# Patient Record
Sex: Female | Born: 1960 | Race: White | Hispanic: No | Marital: Married | Smoking: Never smoker
Health system: Southern US, Community
[De-identification: ages and names within clinical notes are randomized; demographics above are authoritative.]

## PROBLEM LIST (undated history)

## (undated) DIAGNOSIS — M79604 Pain in right leg: Secondary | ICD-10-CM

## (undated) DIAGNOSIS — D649 Anemia, unspecified: Secondary | ICD-10-CM

## (undated) DIAGNOSIS — M79605 Pain in left leg: Secondary | ICD-10-CM

## (undated) DIAGNOSIS — T7840XA Allergy, unspecified, initial encounter: Secondary | ICD-10-CM

## (undated) DIAGNOSIS — F419 Anxiety disorder, unspecified: Secondary | ICD-10-CM

## (undated) DIAGNOSIS — Z8489 Family history of other specified conditions: Secondary | ICD-10-CM

## (undated) HISTORY — DX: Anemia, unspecified: D64.9

## (undated) HISTORY — DX: Allergy, unspecified, initial encounter: T78.40XA

## (undated) HISTORY — PX: CATARACT EXTRACTION: SUR2

## (undated) HISTORY — DX: Pain in left leg: M79.605

## (undated) HISTORY — DX: Pain in right leg: M79.604

## (undated) HISTORY — DX: Anxiety disorder, unspecified: F41.9

---

## 2000-12-07 ENCOUNTER — Other Ambulatory Visit: Admission: RE | Admit: 2000-12-07 | Discharge: 2000-12-07 | Payer: Self-pay | Admitting: Obstetrics & Gynecology

## 2002-07-19 HISTORY — PX: LESION EXCISION: SHX5167

## 2002-08-14 ENCOUNTER — Ambulatory Visit (HOSPITAL_BASED_OUTPATIENT_CLINIC_OR_DEPARTMENT_OTHER): Admission: RE | Admit: 2002-08-14 | Discharge: 2002-08-14 | Payer: Self-pay | Admitting: Plastic Surgery

## 2003-05-09 ENCOUNTER — Encounter: Admission: RE | Admit: 2003-05-09 | Discharge: 2003-08-07 | Payer: Self-pay

## 2003-06-18 ENCOUNTER — Other Ambulatory Visit: Admission: RE | Admit: 2003-06-18 | Discharge: 2003-06-18 | Payer: Self-pay | Admitting: Obstetrics & Gynecology

## 2003-08-08 ENCOUNTER — Encounter: Admission: RE | Admit: 2003-08-08 | Discharge: 2003-11-06 | Payer: Self-pay

## 2004-12-06 ENCOUNTER — Emergency Department (HOSPITAL_COMMUNITY): Admission: EM | Admit: 2004-12-06 | Discharge: 2004-12-06 | Payer: Self-pay | Admitting: Emergency Medicine

## 2007-02-21 ENCOUNTER — Encounter: Admission: RE | Admit: 2007-02-21 | Discharge: 2007-05-22 | Payer: Self-pay | Admitting: Internal Medicine

## 2011-04-29 ENCOUNTER — Encounter: Payer: Self-pay | Admitting: Vascular Surgery

## 2011-05-06 ENCOUNTER — Encounter: Payer: Self-pay | Admitting: Vascular Surgery

## 2011-05-08 ENCOUNTER — Encounter: Payer: Self-pay | Admitting: Vascular Surgery

## 2011-05-20 ENCOUNTER — Ambulatory Visit (INDEPENDENT_AMBULATORY_CARE_PROVIDER_SITE_OTHER): Payer: BC Managed Care – PPO | Admitting: Vascular Surgery

## 2011-05-20 ENCOUNTER — Encounter: Payer: Self-pay | Admitting: Vascular Surgery

## 2011-05-20 ENCOUNTER — Encounter (INDEPENDENT_AMBULATORY_CARE_PROVIDER_SITE_OTHER): Payer: BC Managed Care – PPO

## 2011-05-20 VITALS — BP 144/79 | HR 82 | Resp 16 | Ht 63.0 in | Wt 170.0 lb

## 2011-05-20 DIAGNOSIS — I781 Nevus, non-neoplastic: Secondary | ICD-10-CM

## 2011-05-20 DIAGNOSIS — M79609 Pain in unspecified limb: Secondary | ICD-10-CM

## 2011-05-20 DIAGNOSIS — I83893 Varicose veins of bilateral lower extremities with other complications: Secondary | ICD-10-CM

## 2011-05-20 NOTE — Progress Notes (Signed)
VV initial consult. Bilateral leg pain, left>right.  Pain worse at the end of the day and on long car trips.

## 2011-05-21 NOTE — Consult Note (Signed)
NEW PATIENT CONSULTATION  Kara Carey, BIRKELAND DOB:  [birth year]                                       05/20/2011 [id number]#:[id number]  Patient presents today for evaluation of bilateral lower extremity venous pathology.  She reports a long history of difficulty with swelling and discomfort in her lower extremities bilaterally.  She reports this is more prominent at the end of a long day of standing or on car trips.  She does not have any history of DVT or bleeding.  She does have a history of significant maternal varicose veins requiring treatment.  PAST MEDICAL HISTORY:  Negative for diabetes, hypertension or other major medical difficulties.  She has had removal of prior dermatofibromas.  FAMILY HISTORY:  Positive for varicose veins and arrhythmia in her mother.  No premature atherosclerotic disease in her father.  Elevated cholesterol in a sister and brother.  SOCIAL HISTORY:  She is married.  She has no children.  She works as an Programmer, multimedia.  She does not smoke or drink alcohol.  REVIEW OF SYSTEMS:  No weight loss or gain.  She weighs 170 pounds.  She is 5 feet 3 inches tall.  Review of systems otherwise totally normal.  PHYSICAL EXAMINATION:  A well-developed and well-nourished white female appearing stated age in no acute distress.  Blood pressures is 144/79, pulse 82, respirations 16.  HEENT:  Normal.  Her musculoskeletal shows no major deformity or cyanosis.  Neurologic:  No focal weakness or paresthesias.  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  She underwent duplex evaluation, and this shows no evidence of deep venous reflux and no evidence of significant reflux in her saphenous veins bilaterally.  I discussed the significance of this with patient. I do not see any evidence of venous pathology to explain her leg aching. She has had sclerotherapy treatments in the past with varying  degrees of success and wishes to consider sclerotherapy for cosmetic improvement of these.  I discussed the option of this for the telangiectasia and also for the underlying reticular veins.  She understands the procedure and expected expense of this and wishes to discuss this further.  She was given contact information for Clementeen Hoof, RN, to schedule sclerotherapy at her convenience.    Larina Earthly, M.D. Electronically Signed  TFE/MEDQ  D:  05/20/2011  T:  05/21/2011  Job:  [id number]  cc:   Bufford Buttner, MD

## 2011-05-26 ENCOUNTER — Encounter: Payer: Self-pay | Admitting: *Deleted

## 2011-05-28 ENCOUNTER — Ambulatory Visit (INDEPENDENT_AMBULATORY_CARE_PROVIDER_SITE_OTHER): Payer: BC Managed Care – PPO | Admitting: *Deleted

## 2011-05-28 DIAGNOSIS — I83893 Varicose veins of bilateral lower extremities with other complications: Secondary | ICD-10-CM

## 2011-05-28 NOTE — Progress Notes (Signed)
Pt. Here for her first sclerotherapy tx. Spiders and retics. Scattered over both legs but mostly on backs of knee calf area. Will follow prn.

## 2011-06-08 NOTE — Procedures (Unsigned)
LOWER EXTREMITY VENOUS REFLUX EXAM  INDICATION:  Painful varicose veins.  EXAM:  Using color-flow imaging and pulse Doppler spectral analysis, the bilateral common femoral, superficial femoral, popliteal, posterior tibial, greater and lesser saphenous veins are evaluated.  There is no evidence suggesting deep venous insufficiency in the bilateral lower extremity.  The bilateral saphenofemoral junctions are competent. The bilateral GSV's are competent.  The bilateral proximal short saphenous veins demonstrate competency.  GSV Diameter (used if found to be incompetent only)                                           Right    Left Proximal Greater Saphenous Vein           cm       cm Proximal-to-mid-thigh                     cm       cm Mid thigh                                 cm       cm Mid-distal thigh                          cm       cm Distal thigh                              cm       cm Knee                                      cm       cm  IMPRESSION: 1. Bilateral greater saphenous veins are competent. 2. The bilateral greater saphenous veins are not tortuous. 3. The deep venous system is competent. 4. The bilateral small saphenous veins are competent.  ___________________________________________ Larina Earthly, M.D.  LT/MEDQ  D:  05/20/2011  T:  05/20/2011  Job:  [id number]

## 2011-06-09 ENCOUNTER — Telehealth: Payer: Self-pay | Admitting: *Deleted

## 2011-06-09 NOTE — Telephone Encounter (Signed)
Pt had sclero tx 05/28/11. Calling today c/o "charlie horse" in calf. Area is 3 inches away from injection site. Feels like muscle soreness. Came on three days ago. Area slightly swollen. No apparent bruising, discoloration, hardness. Denies injury. Suggested Ibuprofen, ice or heat; and asked her to keep me posted. No ankle or foot swelling. Pain is not severe, just bothersome. Pt. Appeared to be reassured.

## 2014-05-13 ENCOUNTER — Ambulatory Visit (INDEPENDENT_AMBULATORY_CARE_PROVIDER_SITE_OTHER): Payer: BC Managed Care – PPO | Admitting: Family Medicine

## 2014-05-13 VITALS — BP 115/75 | HR 56 | Temp 97.7°F | Resp 16 | Ht 64.0 in | Wt 163.0 lb

## 2014-05-13 DIAGNOSIS — L659 Nonscarring hair loss, unspecified: Secondary | ICD-10-CM

## 2014-05-13 DIAGNOSIS — Z0000 Encounter for general adult medical examination without abnormal findings: Secondary | ICD-10-CM

## 2014-05-13 LAB — COMPREHENSIVE METABOLIC PANEL
ALT: 15 U/L (ref 0–35)
AST: 17 U/L (ref 0–37)
Albumin: 4.4 g/dL (ref 3.5–5.2)
Alkaline Phosphatase: 78 U/L (ref 39–117)
BUN: 13 mg/dL (ref 6–23)
CO2: 29 mEq/L (ref 19–32)
Calcium: 9.8 mg/dL (ref 8.4–10.5)
Chloride: 102 mEq/L (ref 96–112)
Creat: 0.61 mg/dL (ref 0.50–1.10)
Glucose, Bld: 99 mg/dL (ref 70–99)
Potassium: 4.9 mEq/L (ref 3.5–5.3)
Sodium: 141 mEq/L (ref 135–145)
Total Bilirubin: 0.7 mg/dL (ref 0.2–1.2)
Total Protein: 7.5 g/dL (ref 6.0–8.3)

## 2014-05-13 LAB — POCT CBC
Granulocyte percent: 64.1 %G (ref 37–80)
HCT, POC: 41.2 % (ref 37.7–47.9)
Hemoglobin: 13.4 g/dL (ref 12.2–16.2)
Lymph, poc: 1.5 (ref 0.6–3.4)
MCH, POC: 29.3 pg (ref 27–31.2)
MCHC: 32.4 g/dL (ref 31.8–35.4)
MCV: 90.5 fL (ref 80–97)
MID (cbc): 0.3 (ref 0–0.9)
MPV: 7.1 fL (ref 0–99.8)
POC Granulocyte: 3.1 (ref 2–6.9)
POC LYMPH PERCENT: 30.4 %L (ref 10–50)
POC MID %: 5.5 %M (ref 0–12)
Platelet Count, POC: 323 10*3/uL (ref 142–424)
RBC: 4.56 M/uL (ref 4.04–5.48)
RDW, POC: 13.3 %
WBC: 4.9 10*3/uL (ref 4.6–10.2)

## 2014-05-13 LAB — LIPID PANEL
Cholesterol: 201 mg/dL — ABNORMAL HIGH (ref 0–200)
HDL: 46 mg/dL (ref >39)
LDL Cholesterol: 137 mg/dL — ABNORMAL HIGH (ref 0–99)
Total CHOL/HDL Ratio: 4.4 Ratio
Triglycerides: 91 mg/dL (ref <150)
VLDL: 18 mg/dL (ref 0–40)

## 2014-05-13 LAB — TSH: TSH: 1.404 u[IU]/mL (ref 0.350–4.500)

## 2014-05-13 NOTE — Patient Instructions (Signed)
Patient ID: Kara Carey MRN: [id number], DOB: [birth year], 53 y.o. Date of Encounter: 05/13/2014, 9:57 AM  Primary Physician: No PCP Per Patient  Chief Complaint: Physical (CPE)  HPI: 53 y.o. y/o female with history of noted below here for CPE.  Doing well. No issues/complaints.  LMP: more than 2 years Pap: within last 3 years MMG:  May of this year Last Td: 4 years ago Review of Systems: Consitutional: No fever, chills, fatigue, night sweats, lymphadenopathy, or weight changes. Eyes: No visual changes, eye redness, or discharge. ENT/Mouth: Ears: No otalgia, tinnitus, hearing loss, discharge. Nose: No congestion, rhinorrhea, sinus pain, or epistaxis. Throat: No sore throat, post nasal drip, or teeth pain. Cardiovascular: No CP, palpitations, diaphoresis, DOE, edema, orthopnea, PND. Respiratory: No cough, hemoptysis, SOB, or wheezing. Gastrointestinal: No anorexia, dysphagia, reflux, pain, nausea, vomiting, hematemesis, diarrhea, constipation, BRBPR, or melena. Breast: No discharge, pain, swelling, or mass. Genitourinary: No dysuria, frequency, urgency, hematuria, incontinence, nocturia, amenorrhea, vaginal discharge, pruritis, burning, abnormal bleeding, or pain. Musculoskeletal: No decreased ROM, myalgias, stiffness, joint swelling, or weakness. Skin: No rash, erythema, lesion changes, pain, warmth, jaundice, or pruritis.  Some hair loss Neurological: No headache, dizziness, syncope, seizures, tremors, memory loss, coordination problems, or paresthesias. Psychological: No anxiety, depression, hallucinations, SI/HI. Endocrine: No fatigue, polydipsia, polyphagia, polyuria, or known diabetes.  H/O iron deficiency anemia, treated and corrected All other systems were reviewed and are otherwise negative.  Past Medical History  Diagnosis Date  . Asthma   . Anxiety hx of panic attacks  . Bilateral leg pain   . Allergy   . Anemia      Past Surgical History  Procedure  Laterality Date  . Lesion excision  left upper arm october 2003    Home Meds:  Prior to Admission medications   Medication Sig Start Date End Date Taking? Authorizing Provider  aspirin 81 MG tablet Take 81 mg by mouth daily.     Yes Historical Provider, MD    Allergies:  Allergies  Allergen Reactions  . Levofloxacin   . Sulfa Antibiotics     History   Social History  . Marital Status: Married    Spouse Name: N/A    Number of Children: N/A  . Years of Education: N/A   Occupational History  . Not on file.   Social History Main Topics  . Smoking status: Never Smoker   . Smokeless tobacco: Not on file  . Alcohol Use: No  . Drug Use: No  . Sexual Activity: Not on file   Other Topics Concern  . Not on file   Social History Narrative  . No narrative on file    Family History  Problem Relation Age of Onset  . Heart disease Mother   . Hyperlipidemia Sister   . Hyperlipidemia Brother     Physical Exam:  Waist circumference: 34 Blood pressure 115/75, pulse 56, temperature 97.7 F (36.5 C), resp. rate 16, height 5' 4"  (1.626 m), weight 163 lb (73.936 kg), SpO2 98.00%., Body mass index is 27.97 kg/(m^2). Wt Readings from Last 3 Encounters:  05/13/14 163 lb (73.936 kg)  05/20/11 170 lb (77.111 kg)   BP Readings from Last 3 Encounters:  05/13/14 115/75  05/20/11 144/79   General: Well developed, well nourished, in no acute distress. HEENT: Normocephalic, atraumatic. Conjunctiva pink, sclera non-icteric. Pupils 2 mm constricting to 1 mm, round, regular, and equally reactive to light and accomodation. EOMI. Internal auditory canal clear. TMs with good cone of light and without  pathology. Nasal mucosa pink. Nares are without discharge. No sinus tenderness. Oral mucosa pink. Dentition excellent. Pharynx without exudate.     Visual Acuity  Right Eye Distance:   Left Eye Distance:   Bilateral Distance:    Right Eye Near:   Left Eye Near:    Bilateral Near:       Neck: Supple. Trachea midline. No thyromegaly. Full ROM. No lymphadenopathy. Lungs: Clear to auscultation bilaterally without wheezes, rales, or rhonchi. Breathing is of normal effort and unlabored. Cardiovascular: RRR with S1 S2. No murmurs, rubs, or gallops appreciated. Distal pulses 2+ symmetrically. No carotid or abdominal bruits Abdomen: Soft, non-tender, non-distended with normoactive bowel sounds. No hepatosplenomegaly or masses. No rebound/guarding. No CVA tenderness. Without hernias.  Musculoskeletal: Full range of motion and 5/5 strength throughout. Without swelling, atrophy, tenderness, crepitus, or warmth. Extremities without clubbing, cyanosis, or edema. Calves supple. Skin: Warm and moist without erythema, ecchymosis, wounds, or rash. Neuro: A+Ox3. CN II-XII grossly intact. Moves all extremities spontaneously. Full sensation throughout. Normal gait. DTR 2+ throughout upper and lower extremities. Finger to nose intact. Psych:  Responds to questions appropriately with a normal affect.   No results found for this basename: CHOL, HDL, LDLCALC, LDLDIRECT, TRIG, CHOLHDL    Assessment/Plan:  53 y.o. y/o female here for CPE -Annual physical exam - Plan: POCT CBC, Comprehensive metabolic panel, Lipid panel, TSH  Signed, Robyn Haber, MD 05/13/2014 9:57 AM    Colonoscopy A colonoscopy is an exam to look at the entire large intestine (colon). This exam can help find problems such as tumors, polyps, inflammation, and areas of bleeding. The exam takes about 1 hour.  LET [facility] CARE PROVIDER KNOW ABOUT:   Any allergies you have.  All medicines you are taking, including vitamins, herbs, eye drops, creams, and over-the-counter medicines.  Previous problems you or members of your family have had with the use of anesthetics.  Any blood disorders you have.  Previous surgeries you have had.  Medical conditions you have. RISKS AND COMPLICATIONS  Generally, this is a safe  procedure. However, as with any procedure, complications can occur. Possible complications include:  Bleeding.  Tearing or rupture of the colon wall.  Reaction to medicines given during the exam.  Infection (rare). BEFORE THE PROCEDURE   Ask your health care provider about changing or stopping your regular medicines.  You may be prescribed an oral bowel prep. This involves drinking a large amount of medicated liquid, starting the day before your procedure. The liquid will cause you to have multiple loose stools until your stool is almost clear or light green. This cleans out your colon in preparation for the procedure.  Do not eat or drink anything else once you have started the bowel prep, unless your health care provider tells you it is safe to do so.  Arrange for someone to drive you home after the procedure. PROCEDURE   You will be given medicine to help you relax (sedative).  You will lie on your side with your knees bent.  A long, flexible tube with a light and camera on the end (colonoscope) will be inserted through the rectum and into the colon. The camera sends video back to a computer screen as it moves through the colon. The colonoscope also releases carbon dioxide gas to inflate the colon. This helps your health care provider see the area better.  During the exam, your health care provider may take a small tissue sample (biopsy) to be examined under a  microscope if any abnormalities are found.  The exam is finished when the entire colon has been viewed. AFTER THE PROCEDURE   Do not drive for 24 hours after the exam.  You may have a small amount of blood in your stool.  You may pass moderate amounts of gas and have mild abdominal cramping or bloating. This is caused by the gas used to inflate your colon during the exam.  Ask when your test results will be ready and how you will get your results. Make sure you get your test results. Document Released: 10/02/2000  Document Revised: 07/26/2013 Document Reviewed: 06/12/2013 [facility] Patient Information 2015 [place], [place]. This information is not intended to replace advice given to you by your health care provider. Make sure you discuss any questions you have with your health care provider. Health Maintenance Adopting a healthy lifestyle and getting preventive care can go a long way to promote health and wellness. Talk with your health care provider about what schedule of regular examinations is right for you. This is a good chance for you to check in with your provider about disease prevention and staying healthy. In between checkups, there are plenty of things you can do on your own. Experts have done a lot of research about which lifestyle changes and preventive measures are most likely to keep you healthy. Ask your health care provider for more information. WEIGHT AND DIET  Eat a healthy diet  Be sure to include plenty of vegetables, fruits, low-fat dairy products, and lean protein.  Do not eat a lot of foods high in solid fats, added sugars, or salt.  Get regular exercise. This is one of the most important things you can do for your health.  Most adults should exercise for at least 150 minutes each week. The exercise should increase your heart rate and make you sweat (moderate-intensity exercise).  Most adults should also do strengthening exercises at least twice a week. This is in addition to the moderate-intensity exercise.  Maintain a healthy weight  Body mass index (BMI) is a measurement that can be used to identify possible weight problems. It estimates body fat based on height and weight. Your health care provider can help determine your BMI and help you achieve or maintain a healthy weight.  For females 21 years of age and older:   A BMI below 18.5 is considered underweight.  A BMI of 18.5 to 24.9 is normal.  A BMI of 25 to 29.9 is considered overweight.  A BMI of 30 and above is  considered obese.  Watch levels of cholesterol and blood lipids  You should start having your blood tested for lipids and cholesterol at 55 years of age, then have this test every 5 years.  You may need to have your cholesterol levels checked more often if:  Your lipid or cholesterol levels are high.  You are older than 55 years of age.  You are at high risk for heart disease.  CANCER SCREENING   Lung Cancer  Lung cancer screening is recommended for adults 101-6 years old who are at high risk for lung cancer because of a history of smoking.  A yearly low-dose CT scan of the lungs is recommended for people who:  Currently smoke.  Have quit within the past 15 years.  Have at least a 30-pack-year history of smoking. A pack year is smoking an average of one pack of cigarettes a day for 1 year.  Yearly screening should continue until it  has been 15 years since you quit.  Yearly screening should stop if you develop a health problem that would prevent you from having lung cancer treatment.  Breast Cancer  Practice breast self-awareness. This means understanding how your breasts normally appear and feel.  It also means doing regular breast self-exams. Let your health care provider know about any changes, no matter how small.  If you are in your 20s or 30s, you should have a clinical breast exam (CBE) by a health care provider every 1-3 years as part of a regular health exam.  If you are 98 or older, have a CBE every year. Also consider having a breast X-ray (mammogram) every year.  If you have a family history of breast cancer, talk to your health care provider about genetic screening.  If you are at high risk for breast cancer, talk to your health care provider about having an MRI and a mammogram every year.  Breast cancer gene (BRCA) assessment is recommended for women who have family members with BRCA-related cancers. BRCA-related cancers  include:  Breast.  Ovarian.  Tubal.  Peritoneal cancers.  Results of the assessment will determine the need for genetic counseling and BRCA1 and BRCA2 testing. Cervical Cancer Routine pelvic examinations to screen for cervical cancer are no longer recommended for nonpregnant women who are considered low risk for cancer of the pelvic organs (ovaries, uterus, and vagina) and who do not have symptoms. A pelvic examination may be necessary if you have symptoms including those associated with pelvic infections. Ask your health care provider if a screening pelvic exam is right for you.   The Pap test is the screening test for cervical cancer for women who are considered at risk.  If you had a hysterectomy for a problem that was not cancer or a condition that could lead to cancer, then you no longer need Pap tests.  If you are older than 65 years, and you have had normal Pap tests for the past 10 years, you no longer need to have Pap tests.  If you have had past treatment for cervical cancer or a condition that could lead to cancer, you need Pap tests and screening for cancer for at least 20 years after your treatment.  If you no longer get a Pap test, assess your risk factors if they change (such as having a new sexual partner). This can affect whether you should start being screened again.  Some women have medical problems that increase their chance of getting cervical cancer. If this is the case for you, your health care provider may recommend more frequent screening and Pap tests.  The human papillomavirus (HPV) test is another test that may be used for cervical cancer screening. The HPV test looks for the virus that can cause cell changes in the cervix. The cells collected during the Pap test can be tested for HPV.  The HPV test can be used to screen women 77 years of age and older. Getting tested for HPV can extend the interval between normal Pap tests from three to five years.  An HPV  test also should be used to screen women of any age who have unclear Pap test results.  After 55 years of age, women should have HPV testing as often as Pap tests.  Colorectal Cancer  This type of cancer can be detected and often prevented.  Routine colorectal cancer screening usually begins at 55 years of age and continues through 55 years of age.  Your health care provider may recommend screening at an earlier age if you have risk factors for colon cancer.  Your health care provider may also recommend using home test kits to check for hidden blood in the stool.  A small camera at the end of a tube can be used to examine your colon directly (sigmoidoscopy or colonoscopy). This is done to check for the earliest forms of colorectal cancer.  Routine screening usually begins at age 11.  Direct examination of the colon should be repeated every 5-10 years through 55 years of age. However, you may need to be screened more often if early forms of precancerous polyps or small growths are found. Skin Cancer  Check your skin from head to toe regularly.  Tell your health care provider about any new moles or changes in moles, especially if there is a change in a mole's shape or color.  Also tell your health care provider if you have a mole that is larger than the size of a pencil eraser.  Always use sunscreen. Apply sunscreen liberally and repeatedly throughout the day.  Protect yourself by wearing long sleeves, pants, a wide-brimmed hat, and sunglasses whenever you are outside. HEART DISEASE, DIABETES, AND HIGH BLOOD PRESSURE   Have your blood pressure checked at least every 1-2 years. High blood pressure causes heart disease and increases the risk of stroke.  If you are between 28 years and 75 years old, ask your health care provider if you should take aspirin to prevent strokes.  Have regular diabetes screenings. This involves taking a blood sample to check your fasting blood sugar  level.  If you are at a normal weight and have a low risk for diabetes, have this test once every three years after 55 years of age.  If you are overweight and have a high risk for diabetes, consider being tested at a younger age or more often. PREVENTING INFECTION  Hepatitis B  If you have a higher risk for hepatitis B, you should be screened for this virus. You are considered at high risk for hepatitis B if:  You were born in a country where hepatitis B is common. Ask your health care provider which countries are considered high risk.  Your parents were born in a high-risk country, and you have not been immunized against hepatitis B (hepatitis B vaccine).  You have HIV or AIDS.  You use needles to inject street drugs.  You live with someone who has hepatitis B.  You have had sex with someone who has hepatitis B.  You get hemodialysis treatment.  You take certain medicines for conditions, including cancer, organ transplantation, and autoimmune conditions. Hepatitis C  Blood testing is recommended for:  Everyone born from [birth year] through 1967.  Anyone with known risk factors for hepatitis C. Sexually transmitted infections (STIs)  You should be screened for sexually transmitted infections (STIs) including gonorrhea and chlamydia if:  You are sexually active and are younger than 55 years of age.  You are older than 55 years of age and your health care provider tells you that you are at risk for this type of infection.  Your sexual activity has changed since you were last screened and you are at an increased risk for chlamydia or gonorrhea. Ask your health care provider if you are at risk.  If you do not have HIV, but are at risk, it may be recommended that you take a prescription medicine daily to prevent HIV infection. This is called  pre-exposure prophylaxis (PrEP). You are considered at risk if:  You are sexually active and do not regularly use condoms or know the HIV status  of your partner(s).  You take drugs by injection.  You are sexually active with a partner who has HIV. Talk with your health care provider about whether you are at high risk of being infected with HIV. If you choose to begin PrEP, you should first be tested for HIV. You should then be tested every 3 months for as long as you are taking PrEP.  PREGNANCY   If you are premenopausal and you may become pregnant, ask your health care provider about preconception counseling.  If you may become pregnant, take 400 to 800 micrograms (mcg) of folic acid every day.  If you want to prevent pregnancy, talk to your health care provider about birth control (contraception). OSTEOPOROSIS AND MENOPAUSE   Osteoporosis is a disease in which the bones lose minerals and strength with aging. This can result in serious bone fractures. Your risk for osteoporosis can be identified using a bone density scan.  If you are 73 years of age or older, or if you are at risk for osteoporosis and fractures, ask your health care provider if you should be screened.  Ask your health care provider whether you should take a calcium or vitamin D supplement to lower your risk for osteoporosis.  Menopause may have certain physical symptoms and risks.  Hormone replacement therapy may reduce some of these symptoms and risks. Talk to your health care provider about whether hormone replacement therapy is right for you.  HOME CARE INSTRUCTIONS   Schedule regular health, dental, and eye exams.  Stay current with your immunizations.   Do not use any tobacco products including cigarettes, chewing tobacco, or electronic cigarettes.  If you are pregnant, do not drink alcohol.  If you are breastfeeding, limit how much and how often you drink alcohol.  Limit alcohol intake to no more than 1 drink per day for nonpregnant women. One drink equals 12 ounces of beer, 5 ounces of wine, or 1 ounces of hard liquor.  Do not use street  drugs.  Do not share needles.  Ask your health care provider for help if you need support or information about quitting drugs.  Tell your health care provider if you often feel depressed.  Tell your health care provider if you have ever been abused or do not feel safe at home. Document Released: 04/20/2011 Document Revised: 02/19/2014 Document Reviewed: 09/06/2013 [facility] Patient Information 2015 [place], [place]. This information is not intended to replace advice given to you by your health care provider. Make sure you discuss any questions you have with your health care provider.

## 2014-05-13 NOTE — Progress Notes (Signed)
Patient ID: Kara Carey MRN: [id number], DOB: [birth year], 53 y.o. Date of Encounter: 05/13/2014, 9:57 AM  Primary Physician: No PCP Per Patient  Chief Complaint: Physical (CPE)  HPI: 53 y.o. y/o female with history of noted below here for CPE.  Doing well. No issues/complaints.  LMP: more than 2 years Pap: within last 3 years MMG:  May of this year Last Td: 4 years ago Review of Systems: Consitutional: No fever, chills, fatigue, night sweats, lymphadenopathy, or weight changes. Eyes: No visual changes, eye redness, or discharge. ENT/Mouth: Ears: No otalgia, tinnitus, hearing loss, discharge. Nose: No congestion, rhinorrhea, sinus pain, or epistaxis. Throat: No sore throat, post nasal drip, or teeth pain. Cardiovascular: No CP, palpitations, diaphoresis, DOE, edema, orthopnea, PND. Respiratory: No cough, hemoptysis, SOB, or wheezing. Gastrointestinal: No anorexia, dysphagia, reflux, pain, nausea, vomiting, hematemesis, diarrhea, constipation, BRBPR, or melena. Breast: No discharge, pain, swelling, or mass. Genitourinary: No dysuria, frequency, urgency, hematuria, incontinence, nocturia, amenorrhea, vaginal discharge, pruritis, burning, abnormal bleeding, or pain. Musculoskeletal: No decreased ROM, myalgias, stiffness, joint swelling, or weakness. Skin: No rash, erythema, lesion changes, pain, warmth, jaundice, or pruritis.  Some hair loss Neurological: No headache, dizziness, syncope, seizures, tremors, memory loss, coordination problems, or paresthesias. Psychological: No anxiety, depression, hallucinations, SI/HI. Endocrine: No fatigue, polydipsia, polyphagia, polyuria, or known diabetes.  H/O iron deficiency anemia, treated and corrected All other systems were reviewed and are otherwise negative.  Past Medical History  Diagnosis Date  . Asthma   . Anxiety hx of panic attacks  . Bilateral leg pain   . Allergy   . Anemia      Past Surgical History  Procedure  Laterality Date  . Lesion excision  left upper arm october 2003    Home Meds:  Prior to Admission medications   Medication Sig Start Date End Date Taking? Authorizing Provider  aspirin 81 MG tablet Take 81 mg by mouth daily.     Yes Historical Provider, MD    Allergies:  Allergies  Allergen Reactions  . Levofloxacin   . Sulfa Antibiotics     History   Social History  . Marital Status: Married    Spouse Name: N/A    Number of Children: N/A  . Years of Education: N/A   Occupational History  . Not on file.   Social History Main Topics  . Smoking status: Never Smoker   . Smokeless tobacco: Not on file  . Alcohol Use: No  . Drug Use: No  . Sexual Activity: Not on file   Other Topics Concern  . Not on file   Social History Narrative  . No narrative on file    Family History  Problem Relation Age of Onset  . Heart disease Mother   . Hyperlipidemia Sister   . Hyperlipidemia Brother     Physical Exam:  Waist circumference: 34 Blood pressure 115/75, pulse 56, temperature 97.7 F (36.5 C), resp. rate 16, height 5' 4"  (1.626 m), weight 163 lb (73.936 kg), SpO2 98.00%., Body mass index is 27.97 kg/(m^2). Wt Readings from Last 3 Encounters:  05/13/14 163 lb (73.936 kg)  05/20/11 170 lb (77.111 kg)   BP Readings from Last 3 Encounters:  05/13/14 115/75  05/20/11 144/79   General: Well developed, well nourished, in no acute distress. HEENT: Normocephalic, atraumatic. Conjunctiva pink, sclera non-icteric. Pupils 2 mm constricting to 1 mm, round, regular, and equally reactive to light and accomodation. EOMI. Internal auditory canal clear. TMs with good cone of light and without  pathology. Nasal mucosa pink. Nares are without discharge. No sinus tenderness. Oral mucosa pink. Dentition excellent. Pharynx without exudate.     Visual Acuity  Right Eye Distance:   Left Eye Distance:   Bilateral Distance:    Right Eye Near:   Left Eye Near:    Bilateral Near:       Neck: Supple. Trachea midline. No thyromegaly. Full ROM. No lymphadenopathy. Lungs: Clear to auscultation bilaterally without wheezes, rales, or rhonchi. Breathing is of normal effort and unlabored. Cardiovascular: RRR with S1 S2. No murmurs, rubs, or gallops appreciated. Distal pulses 2+ symmetrically. No carotid or abdominal bruits Abdomen: Soft, non-tender, non-distended with normoactive bowel sounds. No hepatosplenomegaly or masses. No rebound/guarding. No CVA tenderness. Without hernias.  Musculoskeletal: Full range of motion and 5/5 strength throughout. Without swelling, atrophy, tenderness, crepitus, or warmth. Extremities without clubbing, cyanosis, or edema. Calves supple. Skin: Warm and moist without erythema, ecchymosis, wounds, or rash. Neuro: A+Ox3. CN II-XII grossly intact. Moves all extremities spontaneously. Full sensation throughout. Normal gait. DTR 2+ throughout upper and lower extremities. Finger to nose intact. Psych:  Responds to questions appropriately with a normal affect.   No results found for this basename: CHOL, HDL, LDLCALC, LDLDIRECT, TRIG, CHOLHDL    Assessment/Plan:  53 y.o. y/o female here for CPE -Annual physical exam - Plan: POCT CBC, Comprehensive metabolic panel, Lipid panel, TSH  Signed, Robyn Haber, MD 05/13/2014 9:57 AM

## 2014-07-31 ENCOUNTER — Ambulatory Visit: Payer: BC Managed Care – PPO | Admitting: Family Medicine

## 2014-08-14 ENCOUNTER — Ambulatory Visit (INDEPENDENT_AMBULATORY_CARE_PROVIDER_SITE_OTHER): Payer: BC Managed Care – PPO | Admitting: Family Medicine

## 2014-08-14 ENCOUNTER — Encounter: Payer: Self-pay | Admitting: Family Medicine

## 2014-08-14 VITALS — BP 132/82 | HR 73 | Temp 98.1°F | Resp 16 | Ht 63.5 in | Wt 167.6 lb

## 2014-08-14 DIAGNOSIS — J321 Chronic frontal sinusitis: Secondary | ICD-10-CM

## 2014-08-14 MED ORDER — PREDNISONE 20 MG PO TABS: 20.0000 mg | ORAL_TABLET | Freq: Two times a day (BID) | ORAL | Status: DC

## 2014-08-14 MED ORDER — FLUTICASONE PROPIONATE 50 MCG/ACT NA SUSP: 2.0000 | Freq: Every day | NASAL | Status: DC

## 2014-08-14 NOTE — Progress Notes (Signed)
   Subjective:    Patient ID: Kara Carey, female    DOB: May 16, 1961, 53 y.o.   MRN: [id number]  HPI Patient presents today with chronic sinus problems. She has noticed 10 months of off and on post nasal drainage. 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 She tried Claritin. She has had post nasal drainage that is colored- dark red, green, thick. Does not have drainage from her nose. Occasional left maxillary pain, no other headaches. No sore throat. Some swollen neck glands a couple of weeks ago. Does not feel bad.   Has not tried decongestants. Did not think that nasacort helped her- she used it for at least a month.  Review of Systems No fever, no wheezing, no SOB, no pain in ears    Objective:   Physical Exam  Vitals reviewed. Constitutional: She is oriented to person, place, and time. She appears well-developed and well-nourished.  HENT:  Head: Normocephalic and atraumatic.  Right Ear: Tympanic membrane, external ear and ear canal normal.  Left Ear: Tympanic membrane, external ear and ear canal normal.  Nose: Mucosal edema and septal deviation present. Right sinus exhibits no maxillary sinus tenderness and no frontal sinus tenderness. Left sinus exhibits maxillary sinus tenderness. Left sinus exhibits no frontal sinus tenderness.  Mouth/Throat: Uvula is midline, oropharynx is clear and moist and mucous membranes are normal.  Cardiovascular: Normal rate, regular rhythm and normal heart sounds.   Pulmonary/Chest: Effort normal and breath sounds normal.  Musculoskeletal: Normal range of motion.  Neurological: She is alert and oriented to person, place, and time.  Skin: Skin is warm and dry.  Psychiatric: She has a normal mood and affect. Her behavior is normal. Judgment and thought content normal.      Assessment & Plan:  1. Chronic frontal sinusitis -Provided written and verbal information regarding diagnosis and treatment. -This does not seem bacterial, will  do a trial of treatment and if no improvement in 1-2 weeks will send to ENT - predniSONE (DELTASONE) 20 MG tablet; Take 1 tablet (20 mg total) by mouth 2 (two) times daily with a meal.  Dispense: 10 tablet; Refill: 1 - fluticasone (FLONASE) 50 MCG/ACT nasal spray; Place 2 sprays into both nostrils daily.  Dispense: 16 g; Refill: 6 - discussed decongestant and Mucinex use  Elby Beck, FNP-BC  [facility]  08/16/2014 6:45 PM

## 2014-08-14 NOTE — Patient Instructions (Signed)
Once finished with the prednisone, can try sudafed (generic fine)- take one at breakfast and one after lunch as needed for congestion Call if not significantly improved in 2 weeks for ENT referral Sinusitis Sinusitis is redness, soreness, and inflammation of the paranasal sinuses. Paranasal sinuses are air pockets within the bones of your face (beneath the eyes, the middle of the forehead, or above the eyes). In healthy paranasal sinuses, mucus is able to drain out, and air is able to circulate through them by way of your nose. However, when your paranasal sinuses are inflamed, mucus and air can become trapped. This can allow bacteria and other germs to grow and cause infection. Sinusitis can develop quickly and last only a short time (acute) or continue over a long period (chronic). Sinusitis that lasts for more than 12 weeks is considered chronic.  CAUSES  Causes of sinusitis include:  Allergies.  Structural abnormalities, such as displacement of the cartilage that separates your nostrils (deviated septum), which can decrease the air flow through your nose and sinuses and affect sinus drainage.  Functional abnormalities, such as when the small hairs (cilia) that line your sinuses and help remove mucus do not work properly or are not present. SIGNS AND SYMPTOMS  Symptoms of acute and chronic sinusitis are the same. The primary symptoms are pain and pressure around the affected sinuses. Other symptoms include:  Upper toothache.  Earache.  Headache.  Bad breath.  Decreased sense of smell and taste.  A cough, which worsens when you are lying flat.  Fatigue.  Fever.  Thick drainage from your nose, which often is green and may contain pus (purulent).  Swelling and warmth over the affected sinuses. DIAGNOSIS  Your health care provider will perform a physical exam. During the exam, your health care provider may:  Look in your nose for signs of abnormal growths in your nostrils (nasal  polyps).  Tap over the affected sinus to check for signs of infection.  View the inside of your sinuses (endoscopy) using an imaging device that has a light attached (endoscope). If your health care provider suspects that you have chronic sinusitis, one or more of the following tests may be recommended:  Allergy tests.  Nasal culture. A sample of mucus is taken from your nose, sent to a lab, and screened for bacteria.  Nasal cytology. A sample of mucus is taken from your nose and examined by your health care provider to determine if your sinusitis is related to an allergy. TREATMENT  Most cases of acute sinusitis are related to a viral infection and will resolve on their own within 10 days. Sometimes medicines are prescribed to help relieve symptoms (pain medicine, decongestants, nasal steroid sprays, or saline sprays).  However, for sinusitis related to a bacterial infection, your health care provider will prescribe antibiotic medicines. These are medicines that will help kill the bacteria causing the infection.  Rarely, sinusitis is caused by a fungal infection. In theses cases, your health care provider will prescribe antifungal medicine. For some cases of chronic sinusitis, surgery is needed. Generally, these are cases in which sinusitis recurs more than 3 times per year, despite other treatments. HOME CARE INSTRUCTIONS   Drink plenty of water. Water helps thin the mucus so your sinuses can drain more easily.  Use a humidifier.  Inhale steam 3 to 4 times a day (for example, sit in the bathroom with the shower running).  Apply a warm, moist washcloth to your face 3 to 4 times a  day, or as directed by your health care provider.  Use saline nasal sprays to help moisten and clean your sinuses.  Take medicines only as directed by your health care provider.  If you were prescribed either an antibiotic or antifungal medicine, finish it all even if you start to feel better. SEEK IMMEDIATE  MEDICAL CARE IF:  You have increasing pain or severe headaches.  You have nausea, vomiting, or drowsiness.  You have swelling around your face.  You have vision problems.  You have a stiff neck.  You have difficulty breathing. MAKE SURE YOU:   Understand these instructions.  Will watch your condition.  Will get help right away if you are not doing well or get worse. Document Released: 10/05/2005 Document Revised: 02/19/2014 Document Reviewed: 10/20/2011 [facility] Patient Information 2015 [place], [place]. This information is not intended to replace advice given to you by your health care provider. Make sure you discuss any questions you have with your health care provider.

## 2014-09-03 ENCOUNTER — Telehealth: Payer: Self-pay

## 2014-09-03 DIAGNOSIS — J321 Chronic frontal sinusitis: Secondary | ICD-10-CM

## 2014-09-03 NOTE — Telephone Encounter (Signed)
Sent referral per OV notes. LM to advise.

## 2014-09-03 NOTE — Telephone Encounter (Signed)
PT CALLED AND STATES WAS SEEN BY DEBBIE GESSNER LAST WEEK. PT STATES NOT BETTER AND IS REQUESTING A REFERRAL TO AN ENT PLEASE CALL PT TO ADVISE

## 2014-12-20 ENCOUNTER — Telehealth: Payer: Self-pay

## 2014-12-20 NOTE — Telephone Encounter (Signed)
Clarene Carey. Pt states that she needs a referral for [facility], her ins would not pay for her CT she recently had since she did not have a referral.   Best # [phone]

## 2014-12-20 NOTE — Telephone Encounter (Signed)
Assessment & Plan:  1. Chronic frontal sinusitis -Provided written and verbal information regarding diagnosis and treatment. -This does not seem bacterial, will do a trial of treatment and if no improvement in 1-2 weeks will send to ENT - predniSONE (DELTASONE) 20 MG tablet; Take 1 tablet (20 mg total) by mouth 2 (two) times daily with a meal. Dispense: 10 tablet; Refill: 1 - fluticasone (FLONASE) 50 MCG/ACT nasal spray; Place 2 sprays into both nostrils daily. Dispense: 16 g; Refill: 6 - discussed decongestant and Mucinex use  Elby Beck, FNP-BC  [facility] [place] to refer right?

## 2014-12-21 ENCOUNTER — Other Ambulatory Visit: Payer: Self-pay | Admitting: Family Medicine

## 2014-12-21 DIAGNOSIS — J320 Chronic maxillary sinusitis: Secondary | ICD-10-CM

## 2014-12-21 NOTE — Telephone Encounter (Signed)
Left a detailed message, letting her know referral was put in and if she needed to speak with me she could call me back.

## 2014-12-21 NOTE — Telephone Encounter (Signed)
I have put in another referral to ENT- [facility]. I'm not sure what the patient is referring to as far as a CT is concerned and her insurance not paying. I did not order a CT.

## 2015-05-08 ENCOUNTER — Ambulatory Visit (INDEPENDENT_AMBULATORY_CARE_PROVIDER_SITE_OTHER): Payer: BLUE CROSS/BLUE SHIELD | Admitting: Family Medicine

## 2015-05-08 VITALS — BP 146/84 | HR 87 | Temp 98.7°F | Resp 16 | Ht 63.0 in | Wt 176.8 lb

## 2015-05-08 DIAGNOSIS — M791 Myalgia: Secondary | ICD-10-CM

## 2015-05-08 DIAGNOSIS — J029 Acute pharyngitis, unspecified: Secondary | ICD-10-CM | POA: Diagnosis not present

## 2015-05-08 DIAGNOSIS — M609 Myositis, unspecified: Secondary | ICD-10-CM | POA: Diagnosis not present

## 2015-05-08 DIAGNOSIS — M2550 Pain in unspecified joint: Secondary | ICD-10-CM

## 2015-05-08 DIAGNOSIS — IMO0001 Reserved for inherently not codable concepts without codable children: Secondary | ICD-10-CM

## 2015-05-08 DIAGNOSIS — E559 Vitamin D deficiency, unspecified: Secondary | ICD-10-CM

## 2015-05-08 LAB — POCT CBC
GRANULOCYTE PERCENT: 66 % (ref 37–80)
HEMATOCRIT: 38.9 % (ref 37.7–47.9)
HEMOGLOBIN: 13.2 g/dL (ref 12.2–16.2)
Lymph, poc: 1.6 (ref 0.6–3.4)
MCH: 29.6 pg (ref 27–31.2)
MCHC: 33.9 g/dL (ref 31.8–35.4)
MCV: 87.4 fL (ref 80–97)
MID (cbc): 0.6 (ref 0–0.9)
MPV: 6.3 fL (ref 0–99.8)
POC GRANULOCYTE: 4.4 (ref 2–6.9)
POC LYMPH PERCENT: 24.2 %L (ref 10–50)
POC MID %: 9.8 %M (ref 0–12)
Platelet Count, POC: 334 10*3/uL (ref 142–424)
RBC: 4.46 M/uL (ref 4.04–5.48)
RDW, POC: 12.8 %
WBC: 6.6 10*3/uL (ref 4.6–10.2)

## 2015-05-08 LAB — POCT RAPID STREP A (OFFICE): Rapid Strep A Screen: NEGATIVE

## 2015-05-08 LAB — POCT SEDIMENTATION RATE: POCT SED RATE: 34 mm/hr — AB (ref 0–22)

## 2015-05-08 NOTE — Patient Instructions (Signed)
Chronic Fatigue Syndrome Chronic fatigue syndrome (CFS) is a condition in which there is lasting, extreme tiredness (fatigue) that does not improve with rest. CFS affects women up to four times more often than men. If you have CFS, fatigue and other symptoms can make it hard for you to get through your day. There is no treatment or cure. You will need to work closely with your health care provider to come up with a treatment plan that works for you. CAUSES  No one knows what causes CFS. It may be triggered by a flu-like illness or by mono. Other triggers may include:  An abnormal immune system.  Low blood pressure.  Poor diet.  Physical or emotional stress. SIGNS AND SYMPTOMS The main symptom is fatigue that lasts all day, especially after physical or mental stress. Other common symptoms include:  An extreme loss of energy with no obvious cause.  Muscle or joint soreness.  Severe weakness.  Frequent headaches.  Fever.  Sore throat.  Swollen lymph glands.  Sleep is not refreshing.  Loss of concentration or memory. Less common symptoms may include:  Chills.  Night sweats.  Tingling or numbness.  Blurred vision.  Dizziness.  Sensitivity to noise or odors.  Mood swings.  Anxiety, panic attacks, and depression. Your symptoms may come and go, or you may have them all the time. DIAGNOSIS  There are no tests that can help health care providers diagnose CFS. It may take a long time for you to get a correct diagnosis. Your health care provider may need to do a number of tests to rule out other conditions that could be causing your symptoms. You may be diagnosed with CFS if:  You have fatigue that has lasted for at least six months.  Your fatigue is not relieved by rest.  Your fatigue is not caused by another condition.  Your fatigue is severe enough to interfere with work and daily activities.  You have at least four common symptoms of CFS. TREATMENT  There is no  cure for CFS at this time. The condition affects everyone differently. You will need to work with your health care provider to find the best treatment for your symptoms. Treatment may include:  Improving sleep with a regular bedtime routine.  Avoiding caffeine, alcohol, and tobacco.  Doing light exercise and stretching during the day.  Taking medicine to help you sleep.  Taking over-the-counter medicines to relieve joint or muscle pain.  Learning and practicing relaxation techniques.  Using memory aids or doing brain teasers to improve memory and concentration.  Seeing a mental health professional to evaluate and treat depression, if necessary.  Trying massage therapy, acupuncture, and movement exercises, like yoga or tai chi. HOME CARE INSTRUCTIONS Work closely with your health care provider to follow your treatment plan at home. You may need to make major lifestyle changes. If treatment does not seem to help, get a second opinion. You may get help from many health care providers, including doctors, mental health specialists, physical therapists, and rehabilitation therapists. Having the support of friends and loved ones is also important. SEEK MEDICAL CARE IF:  Your symptoms are not responding to treatment.  You are having strong feelings of anger, guilt, anxiety, or depression. Document Released: 11/12/2004 Document Revised: 02/19/2014 Document Reviewed: 08/25/2013 [employer] Patient Information 2015 [employer]. This information is not intended to replace advice given to you by your health care provider. Make sure you discuss any questions you have with your health care provider.    Fibromyalgia Fibromyalgia is a disorder that is often misunderstood. It is associated with muscular pains and tenderness that comes and goes. It is often associated with fatigue and sleep disturbances. Though it tends to be long-lasting, fibromyalgia is not life-threatening. CAUSES  The exact cause  of fibromyalgia is unknown. People with certain gene types are predisposed to developing fibromyalgia and other conditions. Certain factors can play a role as triggers, such as:  Spine disorders.  Arthritis.  Severe injury (trauma) and other physical stressors.  Emotional stressors. SYMPTOMS   The main symptom is pain and stiffness in the muscles and joints, which can vary over time.  Sleep and fatigue problems. Other related symptoms may include:  Bowel and bladder problems.  Headaches.  Visual problems.  Problems with odors and noises.  Depression or mood changes.  Painful periods (dysmenorrhea).  Dryness of the skin or eyes. DIAGNOSIS  There are no specific tests for diagnosing fibromyalgia. Patients can be diagnosed accurately from the specific symptoms they have. The diagnosis is made by determining that nothing else is causing the problems. TREATMENT  There is no cure. Management includes medicines and an active, healthy lifestyle. The goal is to enhance physical fitness, decrease pain, and improve sleep. HOME CARE INSTRUCTIONS   Only take over-the-counter or prescription medicines as directed by your caregiver. Sleeping pills, tranquilizers, and pain medicines may make your problems worse.  Low-impact aerobic exercise is very important and advised for treatment. At first, it may seem to make pain worse. Gradually increasing your tolerance will overcome this feeling.  Learning relaxation techniques and how to control stress will help you. Biofeedback, visual imagery, hypnosis, muscle relaxation, yoga, and meditation are all options.  Anti-inflammatory medicines and physical therapy may provide short-term help.  Acupuncture or massage treatments may help.  Take muscle relaxant medicines as suggested by your caregiver.  Avoid stressful situations.  Plan a healthy lifestyle. This includes your diet, sleep, rest, exercise, and friends.  Find and practice a hobby  you enjoy.  Join a fibromyalgia support group for interaction, ideas, and sharing advice. This may be helpful. SEEK MEDICAL CARE IF:  You are not having good results or improvement from your treatment. FOR MORE INFORMATION  National Fibromyalgia Association: www.fmaware.[place]: www.arthritis.org Document Released: 10/05/2005 Document Revised: 12/28/2011 Document Reviewed: 01/15/2010 [facility] Patient Information 2015 [place], [place]. This information is not intended to replace advice given to you by your health care provider. Make sure you discuss any questions you have with your health care provider.  Pharyngitis Pharyngitis is redness, pain, and swelling (inflammation) of your pharynx.  CAUSES  Pharyngitis is usually caused by infection. Most of the time, these infections are from viruses (viral) and are part of a cold. However, sometimes pharyngitis is caused by bacteria (bacterial). Pharyngitis can also be caused by allergies. Viral pharyngitis may be spread from person to person by coughing, sneezing, and personal items or utensils (cups, forks, spoons, toothbrushes). Bacterial pharyngitis may be spread from person to person by more intimate contact, such as kissing.  SIGNS AND SYMPTOMS  Symptoms of pharyngitis include:   Sore throat.   Tiredness (fatigue).   Low-grade fever.   Headache.  Joint pain and muscle aches.  Skin rashes.  Swollen lymph nodes.  Plaque-like film on throat or tonsils (often seen with bacterial pharyngitis). DIAGNOSIS  Your health care provider will ask you questions about your illness and your symptoms. Your medical history, along with a physical exam, is often all that is needed to  diagnose pharyngitis. Sometimes, a rapid strep test is done. Other lab tests may also be done, depending on the suspected cause.  TREATMENT  Viral pharyngitis will usually get better in 3-4 days without the use of medicine. Bacterial pharyngitis is  treated with medicines that kill germs (antibiotics).  HOME CARE INSTRUCTIONS   Drink enough water and fluids to keep your urine clear or pale yellow.   Only take over-the-counter or prescription medicines as directed by your health care provider:   If you are prescribed antibiotics, make sure you finish them even if you start to feel better.   Do not take aspirin.   Get lots of rest.   Gargle with 8 oz of salt water ( tsp of salt per 1 qt of water) as often as every 1-2 hours to soothe your throat.   Throat lozenges (if you are not at risk for choking) or sprays may be used to soothe your throat. SEEK MEDICAL CARE IF:   You have large, tender lumps in your neck.  You have a rash.  You cough up green, yellow-brown, or bloody spit. SEEK IMMEDIATE MEDICAL CARE IF:   Your neck becomes stiff.  You drool or are unable to swallow liquids.  You vomit or are unable to keep medicines or liquids down.  You have severe pain that does not go away with the use of recommended medicines.  You have trouble breathing (not caused by a stuffy nose). MAKE SURE YOU:   Understand these instructions.  Will watch your condition.  Will get help right away if you are not doing well or get worse. Document Released: 10/05/2005 Document Revised: 07/26/2013 Document Reviewed: 06/12/2013 [facility] Patient Information 2015 [place], [place]. This information is not intended to replace advice given to you by your health care provider. Make sure you discuss any questions you have with your health care provider.

## 2015-05-08 NOTE — Progress Notes (Addendum)
Subjective:  This chart was scribed for Delman Cheadle, MD by [facility], medical scribe at [facility].The patient was seen in exam room 08 and the patient's care was started at 7:17 PM.   Patient ID: Kara Carey, female    DOB: September 28, 1961, 54 y.o.   MRN: [id number] Chief Complaint  Patient presents with  . Sore Throat    x 2 days  . Fatigue    Body aches   HPI HPI Comments: Kara Carey is a 54 y.o. female who presents to [facility] complaining of sore throat, with an occasional dry cough for the past day. Pt has not used a salt gargle. She denies fever and chills.  She also noticed she has been tired with associated body aches for the past nine months.  She is not taking anything for relief. She has been unable to exercise due her fatigue, which has caused her to gain some weight. She has ruled out rheumatoid arthritis 1.5 years ago. She does complain of pain in hips and leg, arms are usually ok. She denies weakness, back pain, neck pain, headaches, or night sweats. She does a dry mouth, but trying to drink more water to compensate. No changes in her skin, fingernails. Appetite is ok and normal bowel movements. Occasionally taking vitamin B12 and biotin. .   Seen by her dermatologist reccommended she get her ferritin levels checked as she has had some hair thinning. She rarely eats meat, does not drink milk but does eat vegetables.  Scarring from an under wire bra along her left lower lung/rib.  Depression screen [facility] 2/9 05/08/2015 08/14/2014  Decreased Interest 0 0  Down, Depressed, Hopeless 0 0  PHQ - 2 Score 0 0      Past Medical History  Diagnosis Date  . Asthma   . Anxiety hx of panic attacks  . Bilateral leg pain   . Allergy   . Anemia    Current Outpatient Prescriptions on File Prior to Visit  Medication Sig Dispense Refill  . aspirin 81 MG tablet Take 81 mg by mouth daily.       No current  facility-administered medications on file prior to visit.   Allergies  Allergen Reactions  . Levofloxacin   . Sulfa Antibiotics    Review of Systems  Constitutional: Positive for activity change and fatigue. Negative for fever, chills and diaphoresis.  HENT: Positive for postnasal drip and sore throat. Negative for congestion, ear pain, facial swelling, rhinorrhea, sinus pressure, trouble swallowing and voice change.   Respiratory: Positive for cough. Negative for chest tightness and shortness of breath.   Cardiovascular: Negative for chest pain and palpitations.  Gastrointestinal: Negative for vomiting and abdominal distention.  Endocrine: Positive for polydipsia.  Musculoskeletal: Positive for myalgias and arthralgias. Negative for back pain, joint swelling and neck pain.  Skin: Negative for color change and rash.  Neurological: Negative for weakness and headaches.  Psychiatric/Behavioral: Negative for confusion and dysphoric mood. The patient is not nervous/anxious and is not hyperactive.       Objective:  BP 146/84 mmHg  Pulse 87  Temp(Src) 98.7 F (37.1 C) (Oral)  Resp 16  Ht 5\' 3"  (1.6 m)  Wt 176 lb 12.8 oz (80.196 kg)  BMI 31.33 kg/m2  SpO2 98% Physical Exam  Constitutional: She is oriented to person, place, and time. She appears well-developed and well-nourished. No distress.  HENT:  Head: Normocephalic and atraumatic.  Mouth/Throat: Posterior  oropharyngeal erythema present. No oropharyngeal exudate, posterior oropharyngeal edema or tonsillar abscesses.  Eyes: Pupils are equal, round, and reactive to light.  Neck: Normal range of motion. No thyromegaly present.  Cardiovascular: Normal rate, regular rhythm, S1 normal, S2 normal and normal heart sounds.   No murmur heard. Pulmonary/Chest: Effort normal. No respiratory distress.  Musculoskeletal: Normal range of motion.  Large muscle groups 5/5 strength.  Lymphadenopathy:    She has no cervical adenopathy.    Neurological: She is alert and oriented to person, place, and time.  Skin: Skin is warm and dry.  Psychiatric: She has a normal mood and affect. Her behavior is normal.  Nursing note and vitals reviewed.     Assessment & Plan:   1. Acute pharyngitis, unspecified pharyngitis type   2. Myalgia and myositis - Suspect fibromyalgia / chronic fatigue but pt has never had w/u and eval of sxs so start w/ labs today.  Rec to pt that I would next recommend either a therapeutic trial of prednisone - to see if she improves maybe more PMR type picture and then rheum eval would be indicated vs if did not help could try TCA vs flexeril vs cymbalta to assess sxs response for FM-type picture  3. Arthralgia   4.    Vit D def - high dose once wkly x 6 mos, then otc qd.   Orders Placed This Encounter  Procedures  . Culture, Group A Strep  . Vit D  25 hydroxy (rtn osteoporosis monitoring)  . Vitamin B12  . Comprehensive metabolic panel  . Thyroid Panel With TSH  . Ferritin  . Iron  . IBC Panel  . CK  . ANA  . C-reactive protein  . POCT CBC  . POCT SEDIMENTATION RATE  . POCT rapid strep A    Meds ordered this encounter  Medications  . acetaminophen (TYLENOL) 500 MG tablet    Sig: Take 500 mg by mouth every 6 (six) hours as needed.    I personally performed the services described in this documentation, which was scribed in my presence. The recorded information has been reviewed and considered, and addended by me as needed.  Delman Cheadle, MD MPH

## 2015-05-09 LAB — COMPREHENSIVE METABOLIC PANEL
ALBUMIN: 4.5 g/dL (ref 3.5–5.2)
ALT: 26 U/L (ref 0–35)
AST: 22 U/L (ref 0–37)
Alkaline Phosphatase: 86 U/L (ref 39–117)
BUN: 15 mg/dL (ref 6–23)
CALCIUM: 9.9 mg/dL (ref 8.4–10.5)
CO2: 28 meq/L (ref 19–32)
CREATININE: 0.63 mg/dL (ref 0.50–1.10)
Chloride: 101 mEq/L (ref 96–112)
GLUCOSE: 91 mg/dL (ref 70–99)
Potassium: 4.5 mEq/L (ref 3.5–5.3)
SODIUM: 139 meq/L (ref 135–145)
Total Bilirubin: 0.4 mg/dL (ref 0.2–1.2)
Total Protein: 7.5 g/dL (ref 6.0–8.3)

## 2015-05-09 LAB — IBC PANEL
%SAT: 10 % — ABNORMAL LOW (ref 20–55)
TIBC: 298 ug/dL (ref 250–470)
UIBC: 269 ug/dL (ref 125–400)

## 2015-05-09 LAB — C-REACTIVE PROTEIN: CRP: 1.1 mg/dL — ABNORMAL HIGH (ref <0.60)

## 2015-05-09 LAB — FERRITIN: Ferritin: 72 ng/mL (ref 10–291)

## 2015-05-09 LAB — THYROID PANEL WITH TSH
Free Thyroxine Index: 2.7 (ref 1.4–3.8)
T3 UPTAKE: 29 % (ref 22–35)
T4, Total: 9.3 ug/dL (ref 4.5–12.0)
TSH: 1.419 u[IU]/mL (ref 0.350–4.500)

## 2015-05-09 LAB — CK: Total CK: 81 U/L (ref 7–177)

## 2015-05-09 LAB — VITAMIN D 25 HYDROXY (VIT D DEFICIENCY, FRACTURES): Vit D, 25-Hydroxy: 10 ng/mL — ABNORMAL LOW (ref 30–100)

## 2015-05-09 LAB — VITAMIN B12: Vitamin B-12: 769 pg/mL (ref 211–911)

## 2015-05-09 LAB — IRON: Iron: 29 ug/dL — ABNORMAL LOW (ref 42–145)

## 2015-05-10 LAB — ANA: ANA: NEGATIVE

## 2015-05-10 LAB — CULTURE, GROUP A STREP: ORGANISM ID, BACTERIA: NORMAL

## 2015-05-15 ENCOUNTER — Encounter: Payer: Self-pay | Admitting: Family Medicine

## 2015-05-16 ENCOUNTER — Telehealth: Payer: Self-pay

## 2015-05-16 NOTE — Telephone Encounter (Signed)
Patient requesting someone to call her in regards to her recent lab results. She received in her mychart but has questions regarding them. Please call patient back at [phone]

## 2015-05-17 NOTE — Telephone Encounter (Signed)
Dr. Brigitte Pulse, please review labs. Thanks

## 2015-05-18 MED ORDER — VITAMIN D (ERGOCALCIFEROL) 1.25 MG (50000 UNIT) PO CAPS: 50000.0000 [IU] | ORAL_CAPSULE | ORAL | Status: DC

## 2015-05-18 NOTE — Addendum Note (Signed)
Addended by: Delman Cheadle on: 05/18/2015 03:44 PM   Modules accepted: Orders, SmartSet

## 2015-05-18 NOTE — Telephone Encounter (Signed)
Letter sent to my chart

## 2015-05-20 NOTE — Telephone Encounter (Signed)
Spoke to patient, she received the MyChart results with all information attached. Patient is wanting to know who you recommend her see as far as Rheumatology.

## 2015-05-21 NOTE — Telephone Encounter (Signed)
Sent my chart message to pt with info

## 2015-05-21 NOTE — Telephone Encounter (Signed)
I would recommend Dr. Hurley Cisco - he is in a practice by himself - Telephone: [phone] - office is sev blocks from [facility].  [facility] has some excellent rheumatologists but I think they are undergoing some transition - some just left and hiring new maybe. . .  Dr. Estanislado Pandy is wonderful but very difficult to get into especially without a clear diagnosis.

## 2015-06-17 ENCOUNTER — Ambulatory Visit: Payer: BLUE CROSS/BLUE SHIELD | Admitting: Family Medicine

## 2015-11-05 ENCOUNTER — Other Ambulatory Visit: Payer: Self-pay | Admitting: Family Medicine

## 2016-10-15 ENCOUNTER — Ambulatory Visit (INDEPENDENT_AMBULATORY_CARE_PROVIDER_SITE_OTHER): Payer: BLUE CROSS/BLUE SHIELD | Admitting: Family Medicine

## 2016-10-15 VITALS — BP 158/82 | HR 78 | Temp 98.2°F | Ht 63.0 in | Wt 180.4 lb

## 2016-10-15 DIAGNOSIS — R040 Epistaxis: Secondary | ICD-10-CM

## 2016-10-15 DIAGNOSIS — R0981 Nasal congestion: Secondary | ICD-10-CM

## 2016-10-15 DIAGNOSIS — J040 Acute laryngitis: Secondary | ICD-10-CM

## 2016-10-15 DIAGNOSIS — J0120 Acute ethmoidal sinusitis, unspecified: Secondary | ICD-10-CM

## 2016-10-15 DIAGNOSIS — R030 Elevated blood-pressure reading, without diagnosis of hypertension: Secondary | ICD-10-CM

## 2016-10-15 MED ORDER — OXYMETAZOLINE HCL 0.05 % NA SOLN: 1.0000 | Freq: Two times a day (BID) | NASAL | 0 refills | Status: DC

## 2016-10-15 MED ORDER — FLUCONAZOLE 150 MG PO TABS: 150.0000 mg | ORAL_TABLET | Freq: Every day | ORAL | 0 refills | Status: DC

## 2016-10-15 MED ORDER — AMOXICILLIN-POT CLAVULANATE 875-125 MG PO TABS: 1.0000 | ORAL_TABLET | Freq: Two times a day (BID) | ORAL | 0 refills | Status: DC

## 2016-10-15 NOTE — Progress Notes (Signed)
Chief Complaint  Patient presents with  . Cough    X 3 days sore thorat  . Hoarse    X 3-4 days    HPI   She reports 3 days of sore throat, cough, sore throat She reports that her mother was in the hospital and she was getting congested while staying with her mother She states that she has a very sore throat last night and today she does not feel as bad as she sounds She has left ear pain that is stabbing but has improved She denies fevers but had chills at the onset She reports that she had nasal discharge that was green and bloody She denies myalgias  Asthma She reports that she is not wheezing and this has not triggered her asthma Her asthma has been stable and she has had no recent exacerbations  Elevated blood pressure She denies a history of hypertension but reports that her mother who has dementia who was in the hospital with urosepsis. She thinks that the stress of careing for her mother lead to her high pressure reading She checks her bp at home and it has been  Normal BP Readings from Last 3 Encounters:  10/15/16 (!) 158/82  05/08/15 (!) 146/84  08/14/14 132/82     Past Medical History:  Diagnosis Date  . Allergy   . Anemia   . Anxiety hx of panic attacks  . Asthma   . Bilateral leg pain     Current Outpatient Prescriptions  Medication Sig Dispense Refill  . acetaminophen (TYLENOL) 500 MG tablet Take 500 mg by mouth every 6 (six) hours as needed.    Marland Kitchen aspirin 81 MG tablet Take 81 mg by mouth daily.      . Vitamin D, Ergocalciferol, (DRISDOL) 50000 units CAPS capsule TAKE 1 CAPSULE BY MOUTH EVERY 7 DAYS. 12 capsule 1  . amoxicillin-clavulanate (AUGMENTIN) 875-125 MG tablet Take 1 tablet by mouth 2 (two) times daily. 20 tablet 0  . fluconazole (DIFLUCAN) 150 MG tablet Take 1 tablet (150 mg total) by mouth daily. Take a second tablet in 3 days. 2 tablet 0  . oxymetazoline (AFRIN NASAL SPRAY) 0.05 % nasal spray Place 1 spray into both nostrils 2 (two) times  daily. Take for no more than 2-3 days at a time. 30 mL 0   No current facility-administered medications for this visit.     Allergies:  Allergies  Allergen Reactions  . Levofloxacin   . Sulfa Antibiotics     Past Surgical History:  Procedure Laterality Date  . LESION EXCISION  left upper arm october 2003    Social History   Social History  . Marital status: Married    Spouse name: N/A  . Number of children: N/A  . Years of education: N/A   Social History Main Topics  . Smoking status: Never Smoker  . Smokeless tobacco: Never Used  . Alcohol use No  . Drug use: No  . Sexual activity: Not Asked   Other Topics Concern  . None   Social History Narrative  . None    Review of Systems  Respiratory: Positive for cough.   Cardiovascular: Negative for chest pain, palpitations, orthopnea and claudication.  Neurological: Negative for dizziness and headaches.    Objective: Vitals:   10/15/16 1445  BP: (!) 158/82  Pulse: 78  Temp: 98.2 F (36.8 C)  TempSrc: Oral  SpO2: 98%  Weight: 180 lb 6.4 oz (81.8 kg)  Height: 5\' 3"  (1.6 m)  Physical Exam  General: alert, oriented, in NAD Head: normocephalic, atraumatic, bilateral ethmoid sinus tenderness Eyes: EOM intact, no scleral icterus or conjunctival injection Ears: TM clear bilaterally Nose: left near with small nosebleed with a blood clot noted Throat: no pharyngeal exudate or erythema Lymph: no posterior auricular, submental or cervical lymph adenopathy Heart: normal rate, normal sinus rhythm, no murmurs Lungs: clear to auscultation bilaterally, no wheezing   Assessment and Plan Fayma was seen today for cough and hoarse.  Diagnoses and all orders for this visit:  Elevated blood pressure reading- no history of hypertension Pt with acute sinusitis and cough With acute stress as a caregiver  Epistaxis- afrin nasal spray  Acute non-recurrent ethmoidal sinusitis- will do augmentin  Sinus congestion-  continue nasal spray and add humidifer  Laryngitis- advised lozenges and hydration  Other orders -     amoxicillin-clavulanate (AUGMENTIN) 875-125 MG tablet; Take 1 tablet by mouth 2 (two) times daily. -     oxymetazoline (AFRIN NASAL SPRAY) 0.05 % nasal spray; Place 1 spray into both nostrils 2 (two) times daily. Take for no more than 2-3 days at a time. -     fluconazole (DIFLUCAN) 150 MG tablet; Take 1 tablet (150 mg total) by mouth daily. Take a second tablet in 3 days.     [place]

## 2016-10-15 NOTE — Patient Instructions (Addendum)
IF you received an x-ray today, you will receive an invoice from [facility]. Please contact [facility] at [phone] with questions or concerns regarding your invoice.   IF you received labwork today, you will receive an invoice from [place]. Please contact [facility] at [phone] with questions or concerns regarding your invoice.   Our billing staff will not be able to assist you with questions regarding bills from these companies.  You will be contacted with the lab results as soon as they are available. The fastest way to get your results is to activate your My Chart account. Instructions are located on the last page of this paperwork. If you have not heard from [place] regarding the results in 2 weeks, please contact this office.     Laryngitis Introduction Laryngitis is inflammation of your vocal cords. This causes hoarseness, coughing, loss of voice, sore throat, or a dry throat. Your vocal cords are two bands of muscles that are found in your throat. When you speak, these cords come together and vibrate. These vibrations come out through your mouth as sound. When your vocal cords are inflamed, your voice sounds different. Laryngitis can be temporary (acute) or long-term (chronic). Most cases of acute laryngitis improve with time. Chronic laryngitis is laryngitis that lasts for more than three weeks. What are the causes? Acute laryngitis may be caused by:  A viral infection.  Lots of talking, yelling, or singing. This is also called vocal strain.  Bacterial infections. Chronic laryngitis may be caused by:  Vocal strain.  Injury to your vocal cords.  Acid reflux (gastroesophageal reflux disease or GERD).  Allergies.  Sinus infection.  Smoking.  Alcohol abuse.  Breathing in chemicals or dust.  Growths on the vocal cords. What increases the risk? Risk factors for laryngitis include:  Smoking.  Alcohol abuse.  Having allergies. What are  the signs or symptoms? Symptoms of laryngitis may include:  Low, hoarse voice.  Loss of voice.  Dry cough.  Sore throat.  Stuffy nose. How is this diagnosed? Laryngitis may be diagnosed by:  Physical exam.  Throat culture.  Blood test.  Laryngoscopy. This procedure allows your health care provider to look at your vocal cords with a mirror or viewing tube. How is this treated? Treatment for laryngitis depends on what is causing it. Usually, treatment involves resting your voice and using medicines to soothe your throat. However, if your laryngitis is caused by a bacterial infection, you may need to take antibiotic medicine. If your laryngitis is caused by a growth, you may need to have a procedure to remove it. Follow these instructions at home:  Drink enough fluid to keep your urine clear or pale yellow.  Breathe in moist air. Use a humidifier if you live in a dry climate.  Take medicines only as directed by your health care provider.  If you were prescribed an antibiotic medicine, finish it all even if you start to feel better.  Do not smoke cigarettes or electronic cigarettes. If you need help quitting, ask your health care provider.  Talk as little as possible. Also avoid whispering, which can cause vocal strain.  Write instead of talking. Do this until your voice is back to normal. Contact a health care provider if:  You have a fever.  You have increasing pain.  You have difficulty swallowing. Get help right away if:  You cough up blood.  You have trouble breathing. This information is not intended to replace advice given to  you by your health care provider. Make sure you discuss any questions you have with your health care provider. Document Released: 10/05/2005 Document Revised: 03/12/2016 Document Reviewed: 03/20/2014  2017 Elsevier

## 2018-07-18 ENCOUNTER — Encounter: Payer: Self-pay | Admitting: Rheumatology

## 2018-08-04 NOTE — Progress Notes (Signed)
Office Visit Note  Patient: Kara Carey             Date of Birth: 07-19-1961           MRN: [id number]             PCP: Hayden Rasmussen, MD Referring: Hayden Rasmussen, MD Visit Date: 08/17/2018 Occupation: Ambulance person.  Subjective:  Lower back pain and elevated ESR.   History of Present Illness: Kara Carey is a 57 y.o. female seen in consultation per request of her PCP.  According to patient at age 50 she was diagnosed with uveitis.  She recalls being treated with eyedrops.  She also states that her labs came positive for HLA-B27.  I do not have the records to confirm this.  She states about 2 years ago she had some sore throat and generalized achiness at the time she had labs done which showed elevated sedimentation rate and elevated C-reactive protein.  She was referred to Dr. Charlestine Night who diagnosed her with polymyalgia rheumatica.  She recalls that she was given prednisone which she took only for short.  And discontinued.  Her symptoms did not recur.  In the last few months she has been having early morning lower back pain which wakes her up around 4 AM she states she has difficulty going back to sleep.  She has no difficulty falling asleep.  If she moves around the lower back pain improves.  She is been applying heat to her lower back.  She gives history of recurrent costochondritis in the past and also some hip pain.  She states she has good days and bad days and pain does move around.  She has been unable to exercise in the morning.  She lost her mother about 1-1/2-year ago and has gained weight since then.  She denies any history of joint swelling.  Activities of Daily Living:  Patient reports morning stiffness for 15 minutes.   Patient Reports nocturnal pain.  Difficulty dressing/grooming: Denies Difficulty climbing stairs: Denies Difficulty getting out of chair: Denies Difficulty using hands for taps, buttons, cutlery, and/or writing:  Denies  Review of Systems  Constitutional: Positive for fatigue. Negative for night sweats, weight gain and weight loss.  HENT: Positive for mouth dryness. Negative for mouth sores, trouble swallowing, trouble swallowing and nose dryness.   Eyes: Negative for pain, redness, visual disturbance and dryness.  Respiratory: Negative for cough, shortness of breath and difficulty breathing.   Cardiovascular: Negative for chest pain, palpitations, hypertension, irregular heartbeat and swelling in legs/feet.  Gastrointestinal: Negative for blood in stool, constipation and diarrhea.  Endocrine: Negative for increased urination.  Genitourinary: Negative for vaginal dryness.  Musculoskeletal: Positive for arthralgias, joint pain, myalgias, morning stiffness and myalgias. Negative for joint swelling, muscle weakness and muscle tenderness.  Skin: Positive for hair loss. Negative for color change, rash, skin tightness, ulcers and sensitivity to sunlight.  Allergic/Immunologic: Negative for susceptible to infections.  Neurological: Negative for dizziness, memory loss, night sweats and weakness.  Hematological: Negative for swollen glands.  Psychiatric/Behavioral: Positive for sleep disturbance. Negative for depressed mood. The patient is nervous/anxious.     PMFS History:  Patient Active Problem List   Diagnosis Date Noted  . History of vitamin D deficiency 08/17/2018  . History of asthma 08/17/2018  . History of anxiety 08/17/2018  . History of uveitis 08/17/2018    Past Medical History:  Diagnosis Date  . Allergy   . Anemia   .  Anxiety hx of panic attacks  . Asthma   . Bilateral leg pain     Family History  Problem Relation Age of Onset  . Heart disease Mother   . Dementia Mother   . Hyperlipidemia Sister   . Asthma Brother   . Lymphoma Father   . Rheum arthritis Father   . Heart disease Father   . Diabetes Father   . Hypertension Sister   . Liver disease Brother    Past Surgical  History:  Procedure Laterality Date  . LESION EXCISION  left upper arm october 2003   Social History   Social History Narrative  . Not on file    Objective: Vital Signs: BP (!) 139/94 (BP Location: Right Arm, Patient Position: Sitting, Cuff Size: Normal)   Pulse 90   Resp 14   Ht 5' 3"  (1.6 m)   Wt 192 lb (87.1 kg)   BMI 34.01 kg/m    Physical Exam  Constitutional: She is oriented to person, place, and time. She appears well-developed and well-nourished.  HENT:  Head: Normocephalic and atraumatic.  Eyes: Conjunctivae and EOM are normal.  Neck: Normal range of motion.  Cardiovascular: Normal rate, regular rhythm, normal heart sounds and intact distal pulses.  Pulmonary/Chest: Effort normal and breath sounds normal.  Abdominal: Soft. Bowel sounds are normal.  Lymphadenopathy:    She has no cervical adenopathy.  Neurological: She is alert and oriented to person, place, and time.  Skin: Skin is warm and dry. Capillary refill takes less than 2 seconds.  Psychiatric: She has a normal mood and affect. Her behavior is normal.  Nursing note and vitals reviewed.    Musculoskeletal Exam: C-spine thoracic lumbar spine good range of motion.  She has tenderness on palpation of her lower lumbar region and bilateral SI joints.  Shoulder joints elbow joints wrist joint MCPs PIPs DIPs were in good range of motion with no synovitis.  Hip joints knee joints ankles MTPs PIPs been good range of motion with no synovitis.  CDAI Exam: CDAI Score: Not documented Patient Global Assessment: Not documented; Provider Global Assessment: Not documented Swollen: Not documented; Tender: Not documented Joint Exam   Not documented   There is currently no information documented on the homunculus. Go to the Rheumatology activity and complete the homunculus joint exam.  Investigation: No additional findings.  Imaging: Xr Lumbar Spine 2-3 Views  Result Date: 08/17/2018 Levoscoliosis of the lumbar  spine was noted.  Multilevel spondylosis was noted.  L2-3 and L3-4 narrowing was noted.  No syndesmophytes were noted.  Facet joint arthropathy was noted. Impression: These findings are consistent with levoscoliosis, spondylosis and facet joint arthropathy.  Xr Pelvis 1-2 Views  Result Date: 08/17/2018 Bilateral SI joint sclerosis was noted.  Mild narrowing of the left SI joint was noted. Impression: These findings could be consistent with SI joint osteoarthritis or inflammatory arthritis.   Recent Labs: Lab Results  Component Value Date   WBC 6.6 05/08/2015   HGB 13.2 05/08/2015   NA 139 05/08/2015   K 4.5 05/08/2015   CL 101 05/08/2015   CO2 28 05/08/2015   GLUCOSE 91 05/08/2015   BUN 15 05/08/2015   CREATININE 0.63 05/08/2015   BILITOT 0.4 05/08/2015   ALKPHOS 86 05/08/2015   AST 22 05/08/2015   ALT 26 05/08/2015   PROT 7.5 05/08/2015   ALBUMIN 4.5 05/08/2015   CALCIUM 9.9 05/08/2015    Speciality Comments: No specialty comments available.  Procedures:  No procedures performed Allergies:  Clindamycin; Levofloxacin; and Sulfa antibiotics   Assessment / Plan:     Visit Diagnoses: Chronic SI joint pain -patient complains of lower back pain for the last few months.  She states it wakes her up every morning at 4 AM.  She has morning stiffness.  Plan: XR Lumbar Spine 2-3 Views, XR Pelvis 1-2 Views, the x-ray of the lumbar spine revealed mild disc disease and scoliosis.  The x-ray of the SI joint revealed bilateral sclerosis.  I will obtain following labs and also will schedule MRI of the SI joints to look for sacroiliitis.  HLA-B27 antigen, Sedimentation rate  Chronic midline low back pain without sciatica -patient complains of lower back pain. I have given her a handout on back exercises..  Plan: Urinalysis, Routine w reflex microscopic  Other idiopathic scoliosis, lumbar region-core strengthening exercise were discussed.  DDD (degenerative disc disease), lumbar-she will  benefit from weight loss and lower back exercises.  Other fatigue - Plan: CBC with Differential/Platelet, COMPLETE METABOLIC PANEL WITH GFR, CK, TSH, Serum protein electrophoresis with reflex  Elevated sed rate - 07/18/2018 Sed Rate 49, RF <10, CRP 0.98  History of uveitis - At age 57.  According to patient she had no recurrence and was treated with only eyedrops.  History of vitamin D deficiency-she is on vitamin D supplement.  History of asthma-currently not active.  History of anxiety -patient went through some situational depression and anxiety after loss of her mother 1-1/2-year ago.  Orders: Orders Placed This Encounter  Procedures  . XR Lumbar Spine 2-3 Views  . XR Pelvis 1-2 Views  . MR SACRUM SI JOINTS WO CONTRAST  . CBC with Differential/Platelet  . COMPLETE METABOLIC PANEL WITH GFR  . Urinalysis, Routine w reflex microscopic  . CK  . TSH  . HLA-B27 antigen  . Sedimentation rate  . Serum protein electrophoresis with reflex   No orders of the defined types were placed in this encounter.   Face-to-face time spent with patient was 50 minutes. Greater than 50% of time was spent in counseling and coordination of care.  Follow-Up Instructions: Return for Lower back pain, elevated sedimentation rate.   Bo Merino, MD  Note - This record has been created using Editor, commissioning.  Chart creation errors have been sought, but may not always  have been located. Such creation errors do not reflect on  the standard of medical care.

## 2018-08-17 ENCOUNTER — Encounter: Payer: Self-pay | Admitting: Rheumatology

## 2018-08-17 ENCOUNTER — Ambulatory Visit (INDEPENDENT_AMBULATORY_CARE_PROVIDER_SITE_OTHER): Payer: Self-pay

## 2018-08-17 ENCOUNTER — Ambulatory Visit (INDEPENDENT_AMBULATORY_CARE_PROVIDER_SITE_OTHER): Payer: BLUE CROSS/BLUE SHIELD | Admitting: Rheumatology

## 2018-08-17 VITALS — BP 139/94 | HR 90 | Resp 14 | Ht 63.0 in | Wt 192.0 lb

## 2018-08-17 DIAGNOSIS — M545 Low back pain, unspecified: Secondary | ICD-10-CM

## 2018-08-17 DIAGNOSIS — M5136 Other intervertebral disc degeneration, lumbar region: Secondary | ICD-10-CM | POA: Diagnosis not present

## 2018-08-17 DIAGNOSIS — M533 Sacrococcygeal disorders, not elsewhere classified: Secondary | ICD-10-CM | POA: Diagnosis not present

## 2018-08-17 DIAGNOSIS — M4126 Other idiopathic scoliosis, lumbar region: Secondary | ICD-10-CM | POA: Diagnosis not present

## 2018-08-17 DIAGNOSIS — R700 Elevated erythrocyte sedimentation rate: Secondary | ICD-10-CM

## 2018-08-17 DIAGNOSIS — Z8639 Personal history of other endocrine, nutritional and metabolic disease: Secondary | ICD-10-CM

## 2018-08-17 DIAGNOSIS — R5383 Other fatigue: Secondary | ICD-10-CM

## 2018-08-17 DIAGNOSIS — Z872 Personal history of diseases of the skin and subcutaneous tissue: Secondary | ICD-10-CM

## 2018-08-17 DIAGNOSIS — G8929 Other chronic pain: Secondary | ICD-10-CM

## 2018-08-17 DIAGNOSIS — Z8659 Personal history of other mental and behavioral disorders: Secondary | ICD-10-CM | POA: Insufficient documentation

## 2018-08-17 DIAGNOSIS — Z8709 Personal history of other diseases of the respiratory system: Secondary | ICD-10-CM

## 2018-08-17 DIAGNOSIS — Z8669 Personal history of other diseases of the nervous system and sense organs: Secondary | ICD-10-CM

## 2018-08-17 NOTE — Patient Instructions (Addendum)
Back Exercises The following exercises strengthen the muscles that help to support the back. They also help to keep the lower back flexible. Doing these exercises can help to prevent back pain or lessen existing pain. If you have back pain or discomfort, try doing these exercises 2-3 times each day or as told by your health care provider. When the pain goes away, do them once each day, but increase the number of times that you repeat the steps for each exercise (do more repetitions). If you do not have back pain or discomfort, do these exercises once each day or as told by your health care provider. Exercises Single Knee to Chest  Repeat these steps 3-5 times for each leg: 1. Lie on your back on a firm bed or the floor with your legs extended. 2. Bring one knee to your chest. Your other leg should stay extended and in contact with the floor. 3. Hold your knee in place by grabbing your knee or thigh. 4. Pull on your knee until you feel a gentle stretch in your lower back. 5. Hold the stretch for 10-30 seconds. 6. Slowly release and straighten your leg.  Pelvic Tilt  Repeat these steps 5-10 times: 1. Lie on your back on a firm bed or the floor with your legs extended. 2. Bend your knees so they are pointing toward the ceiling and your feet are flat on the floor. 3. Tighten your lower abdominal muscles to press your lower back against the floor. This motion will tilt your pelvis so your tailbone points up toward the ceiling instead of pointing to your feet or the floor. 4. With gentle tension and even breathing, hold this position for 5-10 seconds.  Cat-Cow  Repeat these steps until your lower back becomes more flexible: 1. Get into a hands-and-knees position on a firm surface. Keep your hands under your shoulders, and keep your knees under your hips. You may place padding under your knees for comfort. 2. Let your head hang down, and point your tailbone toward the floor so your lower back  becomes rounded like the back of a cat. 3. Hold this position for 5 seconds. 4. Slowly lift your head and point your tailbone up toward the ceiling so your back forms a sagging arch like the back of a cow. 5. Hold this position for 5 seconds.  Press-Ups  Repeat these steps 5-10 times: 1. Lie on your abdomen (face-down) on the floor. 2. Place your palms near your head, about shoulder-width apart. 3. While you keep your back as relaxed as possible and keep your hips on the floor, slowly straighten your arms to raise the top half of your body and lift your shoulders. Do not use your back muscles to raise your upper torso. You may adjust the placement of your hands to make yourself more comfortable. 4. Hold this position for 5 seconds while you keep your back relaxed. 5. Slowly return to lying flat on the floor.  Bridges  Repeat these steps 10 times: 1. Lie on your back on a firm surface. 2. Bend your knees so they are pointing toward the ceiling and your feet are flat on the floor. 3. Tighten your buttocks muscles and lift your buttocks off of the floor until your waist is at almost the same height as your knees. You should feel the muscles working in your buttocks and the back of your thighs. If you do not feel these muscles, slide your feet 1-2 inches farther away   from your buttocks. 4. Hold this position for 3-5 seconds. 5. Slowly lower your hips to the starting position, and allow your buttocks muscles to relax completely.  If this exercise is too easy, try doing it with your arms crossed over your chest. Abdominal Crunches  Repeat these steps 5-10 times: 1. Lie on your back on a firm bed or the floor with your legs extended. 2. Bend your knees so they are pointing toward the ceiling and your feet are flat on the floor. 3. Cross your arms over your chest. 4. Tip your chin slightly toward your chest without bending your neck. 5. Tighten your abdominal muscles and slowly raise your  trunk (torso) high enough to lift your shoulder blades a tiny bit off of the floor. Avoid raising your torso higher than that, because it can put too much stress on your low back and it does not help to strengthen your abdominal muscles. 6. Slowly return to your starting position.  Back Lifts Repeat these steps 5-10 times: 1. Lie on your abdomen (face-down) with your arms at your sides, and rest your forehead on the floor. 2. Tighten the muscles in your legs and your buttocks. 3. Slowly lift your chest off of the floor while you keep your hips pressed to the floor. Keep the back of your head in line with the curve in your back. Your eyes should be looking at the floor. 4. Hold this position for 3-5 seconds. 5. Slowly return to your starting position.  Contact a health care provider if:  Your back pain or discomfort gets much worse when you do an exercise.  Your back pain or discomfort does not lessen within 2 hours after you exercise. If you have any of these problems, stop doing these exercises right away. Do not do them again unless your health care provider says that you can. Get help right away if:  You develop sudden, severe back pain. If this happens, stop doing the exercises right away. Do not do them again unless your health care provider says that you can. This information is not intended to replace advice given to you by your health care provider. Make sure you discuss any questions you have with your health care provider. Document Released: 11/12/2004 Document Revised: 02/12/2016 Document Reviewed: 11/29/2014 Elsevier Interactive Patient Education  2017 [place] [facility] Natural anti-inflammatories can help reduce inflammation and joint stiffness without some of the harmful side effects non-steroidal anti-inflammatories (Advil, Motrin, Aleve, etc.)  Tumeric . Recommended dose 400 mg to 600 mg once daily (can cause stomach upset, may increase to  three times daily as tolerated) . Do not take if you are on a blood thinner and stop prior to surgery  Ginger (root or capsules) . Recommended dose 2 grams in three divided doses or 4 cups of tea daily . Do not take if you are on a blood thinner and stop prior to surgery  Fish oil or Omega 3 . Two 3 ounce servings of fish a week or flaxseed, chia seeds, walnuts and almonds . Capsules: 2-3 grams twice daily; make sure it contains at least 30% of EPA/DHA  Tart Cherry (dried or extract or tablets) . Recommended dose 500 mg twice daily  You may be able to find some of these products at your local pharmacy but may also purchase at [employer], [employer], other specialty stores or online.  *Although these are natural products they can still interact with medications.  Always consult  with your doctor or pharmacist when starting new supplements and medications*  Patient should be under the care of a physician while taking these supplements. This may not be reproduced without the permission of Dr. Bo Merino.

## 2018-08-19 LAB — CBC WITH DIFFERENTIAL/PLATELET
Basophils Absolute: 63 cells/uL (ref 0–200)
Basophils Relative: 0.9 %
Eosinophils Absolute: 140 cells/uL (ref 15–500)
Eosinophils Relative: 2 %
HCT: 40.9 % (ref 35.0–45.0)
HEMOGLOBIN: 13.4 g/dL (ref 11.7–15.5)
LYMPHS ABS: 1652 {cells}/uL (ref 850–3900)
MCH: 29.3 pg (ref 27.0–33.0)
MCHC: 32.8 g/dL (ref 32.0–36.0)
MCV: 89.5 fL (ref 80.0–100.0)
MPV: 9.7 fL (ref 7.5–12.5)
Monocytes Relative: 8 %
NEUTROS ABS: 4585 {cells}/uL (ref 1500–7800)
Neutrophils Relative %: 65.5 %
Platelets: 365 10*3/uL (ref 140–400)
RBC: 4.57 10*6/uL (ref 3.80–5.10)
RDW: 12.3 % (ref 11.0–15.0)
Total Lymphocyte: 23.6 %
WBC mixed population: 560 cells/uL (ref 200–950)
WBC: 7 10*3/uL (ref 3.8–10.8)

## 2018-08-19 LAB — CK: Total CK: 65 U/L (ref 29–143)

## 2018-08-19 LAB — COMPLETE METABOLIC PANEL WITH GFR
AG RATIO: 1.5 (calc) (ref 1.0–2.5)
ALT: 18 U/L (ref 6–29)
AST: 16 U/L (ref 10–35)
Albumin: 4.3 g/dL (ref 3.6–5.1)
Alkaline phosphatase (APISO): 96 U/L (ref 33–130)
BILIRUBIN TOTAL: 0.5 mg/dL (ref 0.2–1.2)
BUN: 16 mg/dL (ref 7–25)
CALCIUM: 9.6 mg/dL (ref 8.6–10.4)
CHLORIDE: 103 mmol/L (ref 98–110)
CO2: 30 mmol/L (ref 20–32)
Creat: 0.79 mg/dL (ref 0.50–1.05)
GFR, EST NON AFRICAN AMERICAN: 82 mL/min/{1.73_m2} (ref >=60)
GFR, Est African American: 95 mL/min/{1.73_m2} (ref >=60)
Globulin: 2.9 g/dL (calc) (ref 1.9–3.7)
Glucose, Bld: 91 mg/dL (ref 65–99)
POTASSIUM: 4.4 mmol/L (ref 3.5–5.3)
Sodium: 139 mmol/L (ref 135–146)
Total Protein: 7.2 g/dL (ref 6.1–8.1)

## 2018-08-19 LAB — PROTEIN ELECTROPHORESIS, SERUM, WITH REFLEX
ALPHA 2: 0.7 g/dL (ref 0.5–0.9)
Albumin ELP: 4.1 g/dL (ref 3.8–4.8)
Alpha 1: 0.3 g/dL (ref 0.2–0.3)
Beta 2: 0.5 g/dL (ref 0.2–0.5)
Beta Globulin: 0.4 g/dL (ref 0.4–0.6)
GAMMA GLOBULIN: 1.1 g/dL (ref 0.8–1.7)
Total Protein: 7.1 g/dL (ref 6.1–8.1)

## 2018-08-19 LAB — URINALYSIS, ROUTINE W REFLEX MICROSCOPIC
Bilirubin Urine: NEGATIVE
Glucose, UA: NEGATIVE
HGB URINE DIPSTICK: NEGATIVE
Ketones, ur: NEGATIVE
LEUKOCYTES UA: NEGATIVE
NITRITE: NEGATIVE
PROTEIN: NEGATIVE
Specific Gravity, Urine: 1.021 (ref 1.001–1.03)
pH: 6.5 (ref 5.0–8.0)

## 2018-08-19 LAB — TSH: TSH: 2.11 m[IU]/L (ref 0.40–4.50)

## 2018-08-19 LAB — SEDIMENTATION RATE: Sed Rate: 33 mm/h — ABNORMAL HIGH (ref 0–30)

## 2018-08-19 LAB — HLA-B27 ANTIGEN: HLA-B27 ANTIGEN: POSITIVE — AB

## 2018-08-27 ENCOUNTER — Ambulatory Visit (HOSPITAL_BASED_OUTPATIENT_CLINIC_OR_DEPARTMENT_OTHER)
Admission: RE | Admit: 2018-08-27 | Discharge: 2018-08-27 | Disposition: A | Discharge status: Home / Self Care | Payer: BLUE CROSS/BLUE SHIELD | Source: Ambulatory Visit | Attending: Rheumatology | Admitting: Rheumatology

## 2018-08-27 DIAGNOSIS — M545 Low back pain: Secondary | ICD-10-CM | POA: Insufficient documentation

## 2018-08-27 DIAGNOSIS — G8929 Other chronic pain: Secondary | ICD-10-CM | POA: Diagnosis not present

## 2018-08-27 DIAGNOSIS — R937 Abnormal findings on diagnostic imaging of other parts of musculoskeletal system: Secondary | ICD-10-CM | POA: Diagnosis not present

## 2018-08-27 DIAGNOSIS — N839 Noninflammatory disorder of ovary, fallopian tube and broad ligament, unspecified: Secondary | ICD-10-CM | POA: Insufficient documentation

## 2018-08-27 DIAGNOSIS — M533 Sacrococcygeal disorders, not elsewhere classified: Secondary | ICD-10-CM | POA: Insufficient documentation

## 2018-08-29 ENCOUNTER — Telehealth: Payer: Self-pay | Admitting: *Deleted

## 2018-08-29 ENCOUNTER — Telehealth: Payer: Self-pay | Admitting: Rheumatology

## 2018-08-29 NOTE — Telephone Encounter (Signed)
MRI report faxed to Dr. Arvella Nigh

## 2018-08-29 NOTE — Telephone Encounter (Signed)
Patient called requesting MRI report to be faxed to Dr. Arvella Nigh at [facility].  Patient states she was unable to schedule an appointment with her gynecologist so she made one with Dr. Radene Knee on Wednesday 11/13.  Please fax information to [phone] Attn:  Angie Wednesday

## 2018-08-29 NOTE — Progress Notes (Signed)
I discussed the MRI findings with Betsy.  SI joint revealed only mild edema and possible small erosions.  She also has left ovarian lesion with suspicion of possible ovarian cyst or endometrioma.  I discussed the findings with her and advised her to schedule an appointment with her GYN.  I will forward MRI report to her GYN.

## 2018-08-29 NOTE — Telephone Encounter (Signed)
Patient received results from MRI pelvis from Dr.Devshwar report in epic. Patient last seen at [place] (chart arrived)  for annual exam 05/2008 requesting to be seen this week due to MRI findings. She has not seen a GYN since 2009, notes only seen PCP.  I did explain to patient I would have to check with you for visit this week as it has been 10 years seen last office visit. Please advise

## 2018-08-30 ENCOUNTER — Telehealth: Payer: Self-pay | Admitting: Rheumatology

## 2018-08-30 NOTE — Telephone Encounter (Signed)
Patient called stating she has an appointment with her PCP Dr. Horald Pollen today at 10:40 am and they are requesting a copy of her MRI report to be faxed to their office.  Fax # [phone]

## 2018-08-30 NOTE — Telephone Encounter (Signed)
Called patient to offer a November 14 appointment she states she has an appointment elsewhere tomorrow. But may call us back for a second opinion.

## 2018-08-30 NOTE — Telephone Encounter (Signed)
Will route to appointment desk to schedule

## 2018-08-30 NOTE — Telephone Encounter (Signed)
Please call patient to offer a visit with me at the end of the day or over lunch on Thursday.

## 2018-08-30 NOTE — Telephone Encounter (Signed)
Copy of MRI sent to PCP.

## 2018-08-31 NOTE — Telephone Encounter (Signed)
Dr. Dellis Filbert just [facility]

## 2018-09-06 ENCOUNTER — Ambulatory Visit: Payer: BLUE CROSS/BLUE SHIELD | Admitting: Obstetrics & Gynecology

## 2018-09-06 ENCOUNTER — Telehealth: Payer: Self-pay | Admitting: *Deleted

## 2018-09-06 NOTE — Telephone Encounter (Signed)
Called and left the patient a message to call the office back. Need to schedule a new patient appt  

## 2018-09-08 DIAGNOSIS — M4126 Other idiopathic scoliosis, lumbar region: Secondary | ICD-10-CM | POA: Insufficient documentation

## 2018-09-08 DIAGNOSIS — M51369 Other intervertebral disc degeneration, lumbar region without mention of lumbar back pain or lower extremity pain: Secondary | ICD-10-CM | POA: Insufficient documentation

## 2018-09-08 DIAGNOSIS — M5136 Other intervertebral disc degeneration, lumbar region: Secondary | ICD-10-CM | POA: Insufficient documentation

## 2018-09-08 NOTE — Progress Notes (Deleted)
Office Visit Note  Patient: Kara Carey             Date of Birth: 08-Oct-1961           MRN: [id number]             PCP: Hayden Rasmussen, MD Referring: No ref. provider found Visit Date: 09/22/2018 Occupation: _0 @  Subjective:  No chief complaint on file.   History of Present Illness: Kara Carey is a 57 y.o. female ***   Activities of Daily Living:  Patient reports morning stiffness for *** {minute/hour:19697}.   Patient {ACTIONS;DENIES/REPORTS:21021675::"Denies"} nocturnal pain.  Difficulty dressing/grooming: {ACTIONS;DENIES/REPORTS:21021675::"Denies"} Difficulty climbing stairs: {ACTIONS;DENIES/REPORTS:21021675::"Denies"} Difficulty getting out of chair: {ACTIONS;DENIES/REPORTS:21021675::"Denies"} Difficulty using hands for taps, buttons, cutlery, and/or writing: {ACTIONS;DENIES/REPORTS:21021675::"Denies"}  No Rheumatology ROS completed.   PMFS History:  Patient Active Problem List   Diagnosis Date Noted  . History of vitamin D deficiency 08/17/2018  . History of asthma 08/17/2018  . History of anxiety 08/17/2018  . History of uveitis 08/17/2018    Past Medical History:  Diagnosis Date  . Allergy   . Anemia   . Anxiety hx of panic attacks  . Asthma   . Bilateral leg pain     Family History  Problem Relation Age of Onset  . Heart disease Mother   . Dementia Mother   . Hyperlipidemia Sister   . Asthma Brother   . Lymphoma Father   . Rheum arthritis Father   . Heart disease Father   . Diabetes Father   . Hypertension Sister   . Liver disease Brother    Past Surgical History:  Procedure Laterality Date  . LESION EXCISION  left upper arm october 2003   Social History   Social History Narrative  . Not on file    Objective: Vital Signs: There were no vitals taken for this visit.   Physical Exam   Musculoskeletal Exam: ***  CDAI Exam: CDAI Score: Not documented Patient Global Assessment: Not documented; Provider Global  Assessment: Not documented Swollen: Not documented; Tender: Not documented Joint Exam   Not documented   There is currently no information documented on the homunculus. Go to the Rheumatology activity and complete the homunculus joint exam.  Investigation: No additional findings.  Imaging: Mr Lynann Bologna Joints Wo Contrast  Result Date: 08/27/2018 CLINICAL DATA:  Morning low back pain. EXAM: MRI PELVIS WITHOUT CONTRAST TECHNIQUE: Multiplanar multisequence MR imaging of the pelvis was performed. No intravenous contrast was administered. COMPARISON:  Radiographs 08/17/2018 FINDINGS: Both hips are normally located. Mild degenerative changes bilaterally. No stress fracture or AVN. No hip joint effusion. No periarticular fluid collections to suggest a paralabral cyst. Mild bilateral. Tendinosis without findings for trochanteric bursitis. Moderate bilateral SI joint degenerative changes with areas of sclerosis and early spurring. Mild edema like signal abnormality on both sides of the joint, left greater than right. No definite joint effusions. Suspect small erosions. No sacral stress fracture. The lower lumbar vertebral bodies appear intact. Degenerate disc disease noted at L3-4. There is a 7.8 x 7.4 x 5.4 cm lesion associated with the left ovary. This has increased T1 signal intensity, intermediate T2 signal intensity and dark STIR signal intensity. This is most likely a large hemorrhagic cyst or endometrioma. Recommend GYN consultation. Multiple uterine fibroids are noted. The right ovary appears normal. IMPRESSION: 1. Bilateral SI joint degenerative changes, left greater than right. No significant joint effusion but suspect small erosions. 2. No stress fracture or AVN involving the hips.  3. 7.8 x 7.4 x 5.4 cm hemorrhagic appearing left ovarian lesion, likely endometrioma. Recommend GYN consultation. Electronically Signed   By: Marijo Sanes M.D.   On: 08/27/2018 20:28   Xr Lumbar Spine 2-3  Views  Result Date: 08/17/2018 Levoscoliosis of the lumbar spine was noted.  Multilevel spondylosis was noted.  L2-3 and L3-4 narrowing was noted.  No syndesmophytes were noted.  Facet joint arthropathy was noted. Impression: These findings are consistent with levoscoliosis, spondylosis and facet joint arthropathy.  Xr Pelvis 1-2 Views  Result Date: 08/17/2018 Bilateral SI joint sclerosis was noted.  Mild narrowing of the left SI joint was noted. Impression: These findings could be consistent with SI joint osteoarthritis or inflammatory arthritis.   Recent Labs: Lab Results  Component Value Date   WBC 7.0 08/17/2018   HGB 13.4 08/17/2018   PLT 365 08/17/2018   NA 139 08/17/2018   K 4.4 08/17/2018   CL 103 08/17/2018   CO2 30 08/17/2018   GLUCOSE 91 08/17/2018   BUN 16 08/17/2018   CREATININE 0.79 08/17/2018   BILITOT 0.5 08/17/2018   ALKPHOS 86 05/08/2015   AST 16 08/17/2018   ALT 18 08/17/2018   PROT 7.2 08/17/2018   PROT 7.1 08/17/2018   ALBUMIN 4.5 05/08/2015   CALCIUM 9.6 08/17/2018   GFRAA 95 08/17/2018  UA negative,SPEP negative, CK65,TSH nomal, HLA B-27 positive, ESR 33  Speciality Comments: No specialty comments available.  Procedures:  No procedures performed Allergies: Clindamycin; Levofloxacin; and Sulfa antibiotics   Assessment / Plan:     Visit Diagnoses: No diagnosis found.   Orders: No orders of the defined types were placed in this encounter.  No orders of the defined types were placed in this encounter.   Face-to-face time spent with patient was *** minutes. Greater than 50% of time was spent in counseling and coordination of care.  Follow-Up Instructions: No follow-ups on file.   Bo Merino, MD  Note - This record has been created using Editor, commissioning.  Chart creation errors have been sought, but may not always  have been located. Such creation errors do not reflect on  the standard of medical care.

## 2018-09-09 ENCOUNTER — Telehealth: Payer: Self-pay

## 2018-09-09 NOTE — Telephone Encounter (Signed)
Incoming call from pt in regards of what to expect at her appt here on Monday. Explained to patient that she will speak with physician, may have pelvic exam, and if surgery is decided then she will most likely have a date planned with her physician by the end of her office appt with instructions. Pt voiced understanding.  No other needs per pt at this time.

## 2018-09-12 ENCOUNTER — Telehealth: Payer: Self-pay | Admitting: *Deleted

## 2018-09-12 ENCOUNTER — Encounter: Payer: Self-pay | Admitting: Gynecologic Oncology

## 2018-09-12 ENCOUNTER — Inpatient Hospital Stay: Payer: BLUE CROSS/BLUE SHIELD | Attending: Gynecologic Oncology | Admitting: Gynecologic Oncology

## 2018-09-12 VITALS — BP 158/80 | HR 89 | Temp 98.3°F | Resp 18 | Ht 63.0 in | Wt 193.2 lb

## 2018-09-12 DIAGNOSIS — N83202 Unspecified ovarian cyst, left side: Secondary | ICD-10-CM | POA: Insufficient documentation

## 2018-09-12 NOTE — H&P (View-Only) (Signed)
Consult Note: Gyn-Onc  Consult was requested by Dr. Radene Knee for the evaluation of Kara Carey 57 y.o. female  CC:  Chief Complaint  Patient presents with  . left ovarian cyst    Assessment/Plan:  Ms. Kara Carey  is a 57 y.o.  year old with a left ovarian cyst in the setting of a normal CA 125.  I performed a history, physical examination, and personally reviewed the patient's imaging films including the MRI from 08/27/18.  Given its size and the fact that she is postmenopausal I am in favor of surgical removal with robotic assisted laparoscopic unilateral salpingo-oophorectomy, possible hysterectomy, possible staging with BSO and lymphadenectomy and omental biopsy.  I discussed that we will perform frozen section intraoperatively and proceed with staging procedures if indicated.  I discussed potential operative risks including  bleeding, infection, damage to internal organs (such as bladder,ureters, bowels), blood clot, reoperation and rehospitalization. I offered her contralateral salpingo-oophorectomy, however discussed that there is some evidence of increased all cause mortality for women undergoing elective BSO younger than the age of 75.  Therefore it is my recommendation to preserve her right ovary provided it appears normal and no malignancy is identified on the left.  At this point she is in agreement with that plan.  Surgeries been scheduled for the midportion of December 2019.  All questions were answered.   HPI: Kara Carey is a 57 year old P0 who was seen in consultation at the request of Dr. Radene Knee for a 7.7 cm left ovarian mass.  The patient is asymptomatic with the exception of chronic sacroiliac joint discomfort which she was evaluated with an MRI of the sacrum and SI joints on 08/27/2018.  This revealed an incidentally found 7.8 x 7.4 x 5.4 cm lesion associated with the left ovary with an increased T1 signal intensity, which is felt to be most likely a  large hemorrhagic cyst or endometrioma.  The uterus was small but multiple small fibroids were noted.  The right ovary appeared grossly normal on MR.  She was then seen by Dr. Radene Knee who performed a transvaginal ultrasound scan which revealed a uterus measuring 6.3 x 4.07 x 4 cm with an endometrial thickness of 2 mm.  The right ovary was not visualized in the left ovary measured 7.7 x 6.3 x 8.5 cm with no flow seen to the mass was felt to be either an ovarian fibroma versus an endometrioma and there was no free fluid in the cul-de-sac.  Ca1 25 drawn on September 01, 2018 was normal at 14.  The patient is otherwise a fairly healthy woman.  She has chronic arthritis with concern for potential underlying ankylosing spondylitis.  She is never been pregnant.  She is never had surgery.  She does not take routine medications other than Tylenol and biotin.  She underwent natural menopause at approximately age 26.  Her family history significant for mother who diagnosed with breast cancer at age 10 and a father who was diagnosed with lymphoma.  She works in Agricultural [employer].  She is married.  Her last pap was normal in November 2019 with no high risk HPV detected.  Current Meds:  Outpatient Encounter Medications as of 09/12/2018  Medication Sig  . acetaminophen (TYLENOL) 500 MG tablet Take 500 mg by mouth every 6 (six) hours as needed.  Marland Kitchen aspirin 81 MG tablet Take 81 mg by mouth daily.    . AZELAIC ACID EX Apply topically daily.  Marland Kitchen BIOTIN PO Take by mouth daily.  Marland Kitchen  Cholecalciferol (VITAMIN D3) 5000 units CAPS Take by mouth daily.   No facility-administered encounter medications on file as of 09/12/2018.     Allergy:  Allergies  Allergen Reactions  . Clindamycin Diarrhea  . Levofloxacin   . Sulfa Antibiotics     Social Hx:   Social History   Socioeconomic History  . Marital status: Married    Spouse name: Not on file  . Number of children: Not on file  . Years of education: Not on file  . Highest  education level: Not on file  Occupational History  . Not on file  Social Needs  . Financial resource strain: Not on file  . Food insecurity:    Worry: Not on file    Inability: Not on file  . Transportation needs:    Medical: Not on file    Non-medical: Not on file  Tobacco Use  . Smoking status: Never Smoker  . Smokeless tobacco: Never Used  Substance and Sexual Activity  . Alcohol use: No  . Drug use: No  . Sexual activity: Not on file  Lifestyle  . Physical activity:    Days per week: Not on file    Minutes per session: Not on file  . Stress: Not on file  Relationships  . Social connections:    Talks on phone: Not on file    Gets together: Not on file    Attends religious service: Not on file    Active member of club or organization: Not on file    Attends meetings of clubs or organizations: Not on file    Relationship status: Not on file  . Intimate partner violence:    Fear of current or ex partner: Not on file    Emotionally abused: Not on file    Physically abused: Not on file    Forced sexual activity: Not on file  Other Topics Concern  . Not on file  Social History Narrative  . Not on file    Past Surgical Hx:  Past Surgical History:  Procedure Laterality Date  . LESION EXCISION  left upper arm october 2003    Past Medical Hx:  Past Medical History:  Diagnosis Date  . Allergy   . Anemia   . Anxiety hx of panic attacks  . Asthma   . Bilateral leg pain     Past Gynecological History:  See HPI No LMP recorded. Patient is postmenopausal.  Family Hx:  Family History  Problem Relation Age of Onset  . Heart disease Mother   . Dementia Mother   . Hyperlipidemia Sister   . Asthma Brother   . Lymphoma Father   . Rheum arthritis Father   . Heart disease Father   . Diabetes Father   . Hypertension Sister   . Liver disease Brother     Review of Systems:  Constitutional  Feels well,    ENT Normal appearing ears and nares  bilaterally Skin/Breast  No rash, sores, jaundice, itching, dryness Cardiovascular  No chest pain, shortness of breath, or edema  Pulmonary  No cough or wheeze.  Gastro Intestinal  No nausea, vomitting, or diarrhoea. No bright red blood per rectum, no abdominal pain, change in bowel movement, or constipation.  Genito Urinary  No frequency, urgency, dysuria, no bleeding or pelvic pain Musculo Skeletal  No myalgia, arthralgia, joint swelling or pain  Neurologic  No weakness, numbness, change in gait,  Psychology  No depression, anxiety, insomnia.   Vitals:  Blood pressure Marland Kitchen)  158/80, pulse 89, temperature 98.3 F (36.8 C), temperature source Oral, resp. rate 18, height 5\' 3"  (1.6 m), weight 193 lb 3.2 oz (87.6 kg), SpO2 100 %.  Physical Exam: WD in NAD Neck  Supple NROM, without any enlargements.  Lymph Node Survey No cervical supraclavicular or inguinal adenopathy Cardiovascular  Pulse normal rate, regularity and rhythm. S1 and S2 normal.  Lungs  Clear to auscultation bilateraly, without wheezes/crackles/rhonchi. Good air movement.  Skin  No rash/lesions/breakdown  Psychiatry  Alert and oriented to person, place, and time  Abdomen  Normoactive bowel sounds, abdomen soft, non-tender and overweight without evidence of hernia.  Back No CVA tenderness Genito Urinary  Vulva/vagina: Normal external female genitalia.   No lesions. No discharge or bleeding.  Bladder/urethra:  No lesions or masses, well supported bladder  Vagina: normal  Cervix: Normal appearing, no lesions.  Uterus:  Small, mobile, no parametrial involvement or nodularity.  Adnexa: no palpable masses. Rectal  deferred  Extremities  No bilateral cyanosis, clubbing or edema.   Thereasa Solo, MD  09/12/2018, 5:25 PM

## 2018-09-12 NOTE — Telephone Encounter (Signed)
Late entry of week 09/05/18) patient called asking if records from Dr. Radene Knee office were received , I checked with [facility] and no records received from his office yet. Patient was seen at his office 08/31/18 at Dr.McComb office, patient said he checked CA-125 which was normal( per patient) but still wanted her to see the GYN-oncology (due to mass) has appointment scheduled with Dr.Rossi on 09/12/18, she questioned if she still should see Dr. Dellis Filbert , I explained to her best to keep scheduled OV with Dr.Rossi she will discuss her recommendations and plan for the next step. I told her after she is discharged by Dr. Denman George office she can then start back seeing Dr. Dellis Filbert. Patient verbalized she understood and told me she will follow with the oncology appointment on 09/12/18.

## 2018-09-12 NOTE — Progress Notes (Signed)
Consult Note: Gyn-Onc  Consult was requested by Dr. Radene Knee for the evaluation of Kara Carey 57 y.o. female  CC:  Chief Complaint  Patient presents with  . left ovarian cyst    Assessment/Plan:  Kara Carey  is a 57 y.o.  year old with a left ovarian cyst in the setting of a normal CA 125.  I performed a history, physical examination, and personally reviewed the patient's imaging films including the MRI from 08/27/18.  Given its size and the fact that she is postmenopausal I am in favor of surgical removal with robotic assisted laparoscopic unilateral salpingo-oophorectomy, possible hysterectomy, possible staging with BSO and lymphadenectomy and omental biopsy.  I discussed that we will perform frozen section intraoperatively and proceed with staging procedures if indicated.  I discussed potential operative risks including  bleeding, infection, damage to internal organs (such as bladder,ureters, bowels), blood clot, reoperation and rehospitalization. I offered her contralateral salpingo-oophorectomy, however discussed that there is some evidence of increased all cause mortality for women undergoing elective BSO younger than the age of 11.  Therefore it is my recommendation to preserve her right ovary provided it appears normal and no malignancy is identified on the left.  At this point she is in agreement with that plan.  Surgeries been scheduled for the midportion of December 2019.  All questions were answered.   HPI: Kara Carey is a 57 year old P0 who was seen in consultation at the request of Dr. Radene Knee for a 7.7 cm left ovarian mass.  The patient is asymptomatic with the exception of chronic sacroiliac joint discomfort which she was evaluated with an MRI of the sacrum and SI joints on 08/27/2018.  This revealed an incidentally found 7.8 x 7.4 x 5.4 cm lesion associated with the left ovary with an increased T1 signal intensity, which is felt to be most likely a  large hemorrhagic cyst or endometrioma.  The uterus was small but multiple small fibroids were noted.  The right ovary appeared grossly normal on MR.  She was then seen by Dr. Radene Knee who performed a transvaginal ultrasound scan which revealed a uterus measuring 6.3 x 4.07 x 4 cm with an endometrial thickness of 2 mm.  The right ovary was not visualized in the left ovary measured 7.7 x 6.3 x 8.5 cm with no flow seen to the mass was felt to be either an ovarian fibroma versus an endometrioma and there was no free fluid in the cul-de-sac.  Ca1 25 drawn on September 01, 2018 was normal at 14.  The patient is otherwise a fairly healthy woman.  She has chronic arthritis with concern for potential underlying ankylosing spondylitis.  She is never been pregnant.  She is never had surgery.  She does not take routine medications other than Tylenol and biotin.  She underwent natural menopause at approximately age 57.  Her family history significant for mother who diagnosed with breast cancer at age 28 and a father who was diagnosed with lymphoma.  She works in Agricultural [employer].  She is married.  Her last pap was normal in November 2019 with no high risk HPV detected.  Current Meds:  Outpatient Encounter Medications as of 09/12/2018  Medication Sig  . acetaminophen (TYLENOL) 500 MG tablet Take 500 mg by mouth every 6 (six) hours as needed.  Marland Kitchen aspirin 81 MG tablet Take 81 mg by mouth daily.    . AZELAIC ACID EX Apply topically daily.  Marland Kitchen BIOTIN PO Take by mouth daily.  Marland Kitchen  Cholecalciferol (VITAMIN D3) 5000 units CAPS Take by mouth daily.   No facility-administered encounter medications on file as of 09/12/2018.     Allergy:  Allergies  Allergen Reactions  . Clindamycin Diarrhea  . Levofloxacin   . Sulfa Antibiotics     Social Hx:   Social History   Socioeconomic History  . Marital status: Married    Spouse name: Not on file  . Number of children: Not on file  . Years of education: Not on file  . Highest  education level: Not on file  Occupational History  . Not on file  Social Needs  . Financial resource strain: Not on file  . Food insecurity:    Worry: Not on file    Inability: Not on file  . Transportation needs:    Medical: Not on file    Non-medical: Not on file  Tobacco Use  . Smoking status: Never Smoker  . Smokeless tobacco: Never Used  Substance and Sexual Activity  . Alcohol use: No  . Drug use: No  . Sexual activity: Not on file  Lifestyle  . Physical activity:    Days per week: Not on file    Minutes per session: Not on file  . Stress: Not on file  Relationships  . Social connections:    Talks on phone: Not on file    Gets together: Not on file    Attends religious service: Not on file    Active member of club or organization: Not on file    Attends meetings of clubs or organizations: Not on file    Relationship status: Not on file  . Intimate partner violence:    Fear of current or ex partner: Not on file    Emotionally abused: Not on file    Physically abused: Not on file    Forced sexual activity: Not on file  Other Topics Concern  . Not on file  Social History Narrative  . Not on file    Past Surgical Hx:  Past Surgical History:  Procedure Laterality Date  . LESION EXCISION  left upper arm october 2003    Past Medical Hx:  Past Medical History:  Diagnosis Date  . Allergy   . Anemia   . Anxiety hx of panic attacks  . Asthma   . Bilateral leg pain     Past Gynecological History:  See HPI No LMP recorded. Patient is postmenopausal.  Family Hx:  Family History  Problem Relation Age of Onset  . Heart disease Mother   . Dementia Mother   . Hyperlipidemia Sister   . Asthma Brother   . Lymphoma Father   . Rheum arthritis Father   . Heart disease Father   . Diabetes Father   . Hypertension Sister   . Liver disease Brother     Review of Systems:  Constitutional  Feels well,    ENT Normal appearing ears and nares  bilaterally Skin/Breast  No rash, sores, jaundice, itching, dryness Cardiovascular  No chest pain, shortness of breath, or edema  Pulmonary  No cough or wheeze.  Gastro Intestinal  No nausea, vomitting, or diarrhoea. No bright red blood per rectum, no abdominal pain, change in bowel movement, or constipation.  Genito Urinary  No frequency, urgency, dysuria, no bleeding or pelvic pain Musculo Skeletal  No myalgia, arthralgia, joint swelling or pain  Neurologic  No weakness, numbness, change in gait,  Psychology  No depression, anxiety, insomnia.   Vitals:  Blood pressure Marland Kitchen)  158/80, pulse 89, temperature 98.3 F (36.8 C), temperature source Oral, resp. rate 18, height 5\' 3"  (1.6 m), weight 193 lb 3.2 oz (87.6 kg), SpO2 100 %.  Physical Exam: WD in NAD Neck  Supple NROM, without any enlargements.  Lymph Node Survey No cervical supraclavicular or inguinal adenopathy Cardiovascular  Pulse normal rate, regularity and rhythm. S1 and S2 normal.  Lungs  Clear to auscultation bilateraly, without wheezes/crackles/rhonchi. Good air movement.  Skin  No rash/lesions/breakdown  Psychiatry  Alert and oriented to person, place, and time  Abdomen  Normoactive bowel sounds, abdomen soft, non-tender and overweight without evidence of hernia.  Back No CVA tenderness Genito Urinary  Vulva/vagina: Normal external female genitalia.   No lesions. No discharge or bleeding.  Bladder/urethra:  No lesions or masses, well supported bladder  Vagina: normal  Cervix: Normal appearing, no lesions.  Uterus:  Small, mobile, no parametrial involvement or nodularity.  Adnexa: no palpable masses. Rectal  deferred  Extremities  No bilateral cyanosis, clubbing or edema.   Thereasa Solo, MD  09/12/2018, 5:25 PM

## 2018-09-12 NOTE — Patient Instructions (Signed)
Preparing for your Surgery  Plan for surgery on September 27, 2018 with Dr. Everitt Amber at [facility] will be scheduled for a robotic assisted unilateral salpingo-oophorectomy, possible hysterectomy, possible staging.   Pre-operative Testing -You will receive a phone call from presurgical testing at [facility] to arrange for a pre-operative testing appointment before your surgery.  This appointment normally occurs one to two weeks before your scheduled surgery.   -Bring your insurance card, copy of an advanced directive if applicable, medication list  -At that visit, you will be asked to sign a consent for a possible blood transfusion in case a transfusion becomes necessary during surgery.  The need for a blood transfusion is rare but having consent is a necessary part of your care.     -You should not be taking blood thinners or aspirin at least ten days prior to surgery unless instructed by your surgeon.  Day Before Surgery at [facility] will be asked to take in a light diet the day before surgery.  Avoid carbonated beverages.  You will be advised to have nothing to eat or drink after midnight the evening before.    Eat a light diet the day before surgery.  Examples including soups, broths, toast, yogurt, mashed potatoes.  Things to avoid include carbonated beverages (fizzy beverages), raw fruits and raw vegetables, or beans.   If your bowels are filled with gas, your surgeon will have difficulty visualizing your pelvic organs which increases your surgical risks.  Your role in recovery Your role is to become active as soon as directed by your doctor, while still giving yourself time to heal.  Rest when you feel tired. You will be asked to do the following in order to speed your recovery:  - Cough and breathe deeply. This helps toclear and expand your lungs and can prevent pneumonia. You may be given a spirometer to practice deep breathing. A staff member  will show you how to use the spirometer. - Do mild physical activity. Walking or moving your legs help your circulation and body functions return to normal. A staff member will help you when you try to walk and will provide you with simple exercises. Do not try to get up or walk alone the first time. - Actively manage your pain. Managing your pain lets you move in comfort. We will ask you to rate your pain on a scale of zero to 10. It is your responsibility to tell your doctor or nurse where and how much you hurt so your pain can be treated.  Special Considerations -If you are diabetic, you may be placed on insulin after surgery to have closer control over your blood sugars to promote healing and recovery.  This does not mean that you will be discharged on insulin.  If applicable, your oral antidiabetics will be resumed when you are tolerating a solid diet.  -Your final pathology results from surgery should be available around one week after surgery and the results will be relayed to you when available.  -Dr. Lahoma Crocker is the Surgeon that assists your GYN Oncologist with surgery.  The next day after your surgery you will either see your GYN Oncologist, Dr. Precious Haws, or Dr. Lahoma Crocker.  -FMLA forms can be faxed to [phone] and please allow 5-7 business days for completion.   Blood Transfusion Information WHAT IS A BLOOD TRANSFUSION? A transfusion is the replacement of blood or some of its parts. Blood is made up of  multiple cells which provide different functions.  Red blood cells carry oxygen and are used for blood loss replacement.  White blood cells fight against infection.  Platelets control bleeding.  Plasma helps clot blood.  Other blood products are available for specialized needs, such as hemophilia or other clotting disorders. BEFORE THE TRANSFUSION  Who gives blood for transfusions?   You may be able to donate blood to be used at a later date on  yourself (autologous donation).  Relatives can be asked to donate blood. This is generally not any safer than if you have received blood from a stranger. The same precautions are taken to ensure safety when a relative's blood is donated.  Healthy volunteers who are fully evaluated to make sure their blood is safe. This is blood bank blood. Transfusion therapy is the safest it has ever been in the practice of medicine. Before blood is taken from a donor, a complete history is taken to make sure that person has no history of diseases nor engages in risky social behavior (examples are intravenous drug use or sexual activity with multiple partners). The donor's travel history is screened to minimize risk of transmitting infections, such as malaria. The donated blood is tested for signs of infectious diseases, such as HIV and hepatitis. The blood is then tested to be sure it is compatible with you in order to minimize the chance of a transfusion reaction. If you or a relative donates blood, this is often done in anticipation of surgery and is not appropriate for emergency situations. It takes many days to process the donated blood. RISKS AND COMPLICATIONS Although transfusion therapy is very safe and saves many lives, the main dangers of transfusion include:   Getting an infectious disease.  Developing a transfusion reaction. This is an allergic reaction to something in the blood you were given. Every precaution is taken to prevent this. The decision to have a blood transfusion has been considered carefully by your caregiver before blood is given. Blood is not given unless the benefits outweigh the risks.

## 2018-09-13 NOTE — Patient Instructions (Addendum)
Kara Carey  09/13/2018   Your procedure is scheduled on: Tuesday 09/27/2018  Report to [facility] Main  Entrance              Report to admitting at  0530  AM    Call this number if you have problems the morning of surgery [phone]   Eat a light diet the day before surgery.  Examples including soups, broths, toast, yogurt, mashed potatoes.  Things to avoid include carbonated beverages (fizzy beverages), raw fruits and raw vegetables, or beans.   If your bowels are filled with gas, your surgeon will have difficulty visualizing your pelvic organs which increases your surgical risks.  NO SOLID FOOD AFTER MIDNIGHT THE NIGHT PRIOR TO SURGERY. NOTHING BY MOUTH EXCEPT CLEAR LIQUIDS UNTIL 3 HOURS PRIOR TO [place] SURGERY. PLEASE FINISH ENSURE DRINK PER SURGEON ORDER 3 HOURS PRIOR TO SCHEDULED SURGERY TIME WHICH NEEDS TO BE COMPLETED AT  0430 am.  If you do not drink the carbohydrate drink the morning of surgery, then there will be Nothing to drink after midnight!   CLEAR LIQUID DIET   Foods Allowed                                                                     Foods Excluded  Coffee and tea, regular and decaf                             liquids that you cannot  Plain Jell-O in any flavor                                             see through such as: Fruit ices (not with fruit pulp)                                     milk, soups, orange juice  Iced Popsicles                                    All solid food                                     Cranberry, grape and apple juices Sports drinks like Gatorade Lightly seasoned clear broth or consume(fat free) Sugar, honey syrup  Sample Menu Breakfast                                Lunch                                     Supper Cranberry juice  Beef broth                            Chicken broth Jell-O                                     Grape juice                            Apple juice Coffee or tea                        Jell-O                                      Popsicle                                                Coffee or tea                        Coffee or tea  _____________________________________________________________________    Remember: Do not eat food or drink liquids :After Midnight.               BRUSH YOUR TEETH MORNING OF SURGERY AND RINSE YOUR MOUTH OUT, NO CHEWING GUM CANDY OR MINTS.     Take these medicines the morning of surgery with A SIP OF WATER: none                                You may not have any metal on your body including hair pins and              piercings  Do not wear jewelry, make-up, lotions, powders or perfumes, deodorant             Do not wear nail polish.  Do not shave  48 hours prior to surgery.              Do not bring valuables to the hospital. [place].  Contacts, dentures or bridgework may not be worn into surgery.  Leave suitcase in the car. After surgery it may be brought to your room.     Patients discharged the day of surgery will not be allowed to drive home.  Name and phone number of your driver: spouse- Kara Carey               Please read over the following fact sheets you were given: _____________________________________________________________________             [facility] - Preparing for Surgery Before surgery, you can play an important role.  Because skin is not sterile, your skin needs to be as free of germs as possible.  You can reduce the number of germs on your skin by washing with CHG (chlorahexidine gluconate) soap before surgery.  CHG is an antiseptic cleaner which kills germs and bonds with the skin to continue killing germs even  after washing. Please DO NOT use if you have an allergy to CHG or antibacterial soaps.  If your skin becomes reddened/irritated stop using the CHG and inform your nurse when you arrive at Short Stay. Do not  shave (including legs and underarms) for at least 48 hours prior to the first CHG shower.  You may shave your face/neck. Please follow these instructions carefully:  1.  Shower with CHG Soap the night before surgery and the  morning of Surgery.  2.  If you choose to wash your hair, wash your hair first as usual with your  normal  shampoo.  3.  After you shampoo, rinse your hair and body thoroughly to remove the  shampoo.                           4.  Use CHG as you would any other liquid soap.  You can apply chg directly  to the skin and wash                       Gently with a scrungie or clean washcloth.  5.  Apply the CHG Soap to your body ONLY FROM THE NECK DOWN.   Do not use on face/ open                           Wound or open sores. Avoid contact with eyes, ears mouth and genitals (private parts).                       Wash face,  Genitals (private parts) with your normal soap.             6.  Wash thoroughly, paying special attention to the area where your surgery  will be performed.  7.  Thoroughly rinse your body with warm water from the neck down.  8.  DO NOT shower/wash with your normal soap after using and rinsing off  the CHG Soap.                9.  Pat yourself dry with a clean towel.            10.  Wear clean pajamas.            11.  Place clean sheets on your bed the night of your first shower and do not  sleep with pets. Day of Surgery : Do not apply any lotions/deodorants the morning of surgery.  Please wear clean clothes to the hospital/surgery center.  FAILURE TO FOLLOW THESE INSTRUCTIONS MAY RESULT IN THE CANCELLATION OF YOUR SURGERY PATIENT SIGNATURE_________________________________  NURSE SIGNATURE__________________________________  ________________________________________________________________________   Kara Carey  An incentive spirometer is a tool that can help keep your lungs clear and active. This tool measures how well you are filling your lungs  with each breath. Taking long deep breaths may help reverse or decrease the chance of developing breathing (pulmonary) problems (especially infection) following:  A long period of time when you are unable to move or be active. BEFORE THE PROCEDURE   If the spirometer includes an indicator to show your best effort, your nurse or respiratory therapist will set it to a desired goal.  If possible, sit up straight or lean slightly forward. Try not to slouch.  Hold the incentive spirometer in an upright position. INSTRUCTIONS FOR USE  1. Sit on the edge  of your bed if possible, or sit up as far as you can in bed or on a chair. 2. Hold the incentive spirometer in an upright position. 3. Breathe out normally. 4. Place the mouthpiece in your mouth and seal your lips tightly around it. 5. Breathe in slowly and as deeply as possible, raising the piston or the ball toward the top of the column. 6. Hold your breath for 3-5 seconds or for as long as possible. Allow the piston or ball to fall to the bottom of the column. 7. Remove the mouthpiece from your mouth and breathe out normally. 8. Rest for a few seconds and repeat Steps 1 through 7 at least 10 times every 1-2 hours when you are awake. Take your time and take a few normal breaths between deep breaths. 9. The spirometer may include an indicator to show your best effort. Use the indicator as a goal to work toward during each repetition. 10. After each set of 10 deep breaths, practice coughing to be sure your lungs are clear. If you have an incision (the cut made at the time of surgery), support your incision when coughing by placing a pillow or rolled up towels firmly against it. Once you are able to get out of bed, walk around indoors and cough well. You may stop using the incentive spirometer when instructed by your caregiver.  RISKS AND COMPLICATIONS  Take your time so you do not get dizzy or light-headed.  If you are in pain, you may need to  take or ask for pain medication before doing incentive spirometry. It is harder to take a deep breath if you are having pain. AFTER USE  Rest and breathe slowly and easily.  It can be helpful to keep track of a log of your progress. Your caregiver can provide you with a simple table to help with this. If you are using the spirometer at home, follow these instructions: [place] IF:   You are having difficultly using the spirometer.  You have trouble using the spirometer as often as instructed.  Your pain medication is not giving enough relief while using the spirometer.  You develop fever of 100.5 F (38.1 C) or higher. SEEK IMMEDIATE MEDICAL CARE IF:   You cough up bloody sputum that had not been present before.  You develop fever of 102 F (38.9 C) or greater.  You develop worsening pain at or near the incision site. MAKE SURE YOU:   Understand these instructions.  Will watch your condition.  Will get help right away if you are not doing well or get worse. Document Released: 02/15/2007 Document Revised: 12/28/2011 Document Reviewed: 04/18/2007 Exit[employer] Patient Information 2014 Exit[employer], [place].   ________________________________________________________________________  WHAT IS A BLOOD TRANSFUSION? Blood Transfusion Information  A transfusion is the replacement of blood or some of its parts. Blood is made up of multiple cells which provide different functions.  Red blood cells carry oxygen and are used for blood loss replacement.  White blood cells fight against infection.  Platelets control bleeding.  Plasma helps clot blood.  Other blood products are available for specialized needs, such as hemophilia or other clotting disorders. BEFORE THE TRANSFUSION  Who gives blood for transfusions?   Healthy volunteers who are fully evaluated to make sure their blood is safe. This is blood bank blood. Transfusion therapy is the safest it has ever been in the  practice of medicine. Before blood is taken from a donor, a complete history is  taken to make sure that person has no history of diseases nor engages in risky social behavior (examples are intravenous drug use or sexual activity with multiple partners). The donor's travel history is screened to minimize risk of transmitting infections, such as malaria. The donated blood is tested for signs of infectious diseases, such as HIV and hepatitis. The blood is then tested to be sure it is compatible with you in order to minimize the chance of a transfusion reaction. If you or a relative donates blood, this is often done in anticipation of surgery and is not appropriate for emergency situations. It takes many days to process the donated blood. RISKS AND COMPLICATIONS Although transfusion therapy is very safe and saves many lives, the main dangers of transfusion include:   Getting an infectious disease.  Developing a transfusion reaction. This is an allergic reaction to something in the blood you were given. Every precaution is taken to prevent this. The decision to have a blood transfusion has been considered carefully by your caregiver before blood is given. Blood is not given unless the benefits outweigh the risks. AFTER THE TRANSFUSION  Right after receiving a blood transfusion, you will usually feel much better and more energetic. This is especially true if your red blood cells have gotten low (anemic). The transfusion raises the level of the red blood cells which carry oxygen, and this usually causes an energy increase.  The nurse administering the transfusion will monitor you carefully for complications. HOME CARE INSTRUCTIONS  No special instructions are needed after a transfusion. You may find your energy is better. Speak with your caregiver about any limitations on activity for underlying diseases you may have. SEEK MEDICAL CARE IF:   Your condition is not improving after your transfusion.  You  develop redness or irritation at the intravenous (IV) site. SEEK IMMEDIATE MEDICAL CARE IF:  Any of the following symptoms occur over the next 12 hours:  Shaking chills.  You have a temperature by mouth above 102 F (38.9 C), not controlled by medicine.  Chest, back, or muscle pain.  People around you feel you are not acting correctly or are confused.  Shortness of breath or difficulty breathing.  Dizziness and fainting.  You get a rash or develop hives.  You have a decrease in urine output.  Your urine turns a dark color or changes to pink, red, or brown. Any of the following symptoms occur over the next 10 days:  You have a temperature by mouth above 102 F (38.9 C), not controlled by medicine.  Shortness of breath.  Weakness after normal activity.  The white part of the eye turns yellow (jaundice).  You have a decrease in the amount of urine or are urinating less often.  Your urine turns a dark color or changes to pink, red, or brown. Document Released: 10/02/2000 Document Revised: 12/28/2011 Document Reviewed: 05/21/2008 [facility] Patient Information 2014 [place], [place].  _______________________________________________________________________

## 2018-09-19 ENCOUNTER — Other Ambulatory Visit: Payer: Self-pay

## 2018-09-19 ENCOUNTER — Encounter (HOSPITAL_COMMUNITY): Payer: Self-pay

## 2018-09-19 ENCOUNTER — Encounter (HOSPITAL_COMMUNITY)
Admission: RE | Admit: 2018-09-19 | Discharge: 2018-09-19 | Disposition: A | Discharge status: Home / Self Care | Payer: BLUE CROSS/BLUE SHIELD | Source: Ambulatory Visit | Attending: Gynecologic Oncology | Admitting: Gynecologic Oncology

## 2018-09-19 DIAGNOSIS — N83202 Unspecified ovarian cyst, left side: Secondary | ICD-10-CM | POA: Insufficient documentation

## 2018-09-19 DIAGNOSIS — Z01812 Encounter for preprocedural laboratory examination: Secondary | ICD-10-CM | POA: Diagnosis not present

## 2018-09-19 HISTORY — DX: Family history of other specified conditions: Z84.89

## 2018-09-19 LAB — COMPREHENSIVE METABOLIC PANEL
ALT: 19 U/L (ref 0–44)
AST: 21 U/L (ref 15–41)
Albumin: 4.1 g/dL (ref 3.5–5.0)
Alkaline Phosphatase: 75 U/L (ref 38–126)
Anion gap: 8 (ref 5–15)
BUN: 20 mg/dL (ref 6–20)
CO2: 28 mmol/L (ref 22–32)
Calcium: 9.3 mg/dL (ref 8.9–10.3)
Chloride: 105 mmol/L (ref 98–111)
Creatinine, Ser: 0.81 mg/dL (ref 0.44–1.00)
GFR calc Af Amer: >60 mL/min (ref >60)
GFR calc non Af Amer: >60 mL/min (ref >60)
Glucose, Bld: 90 mg/dL (ref 70–99)
Potassium: 4.4 mmol/L (ref 3.5–5.1)
SODIUM: 141 mmol/L (ref 135–145)
Total Bilirubin: 0.8 mg/dL (ref 0.3–1.2)
Total Protein: 7.6 g/dL (ref 6.5–8.1)

## 2018-09-19 LAB — CBC
HCT: 41.8 % (ref 36.0–46.0)
Hemoglobin: 13 g/dL (ref 12.0–15.0)
MCH: 28.3 pg (ref 26.0–34.0)
MCHC: 31.1 g/dL (ref 30.0–36.0)
MCV: 90.9 fL (ref 80.0–100.0)
Platelets: 348 10*3/uL (ref 150–400)
RBC: 4.6 MIL/uL (ref 3.87–5.11)
RDW: 12.9 % (ref 11.5–15.5)
WBC: 5.8 10*3/uL (ref 4.0–10.5)
nRBC: 0 % (ref 0.0–0.2)

## 2018-09-19 LAB — URINALYSIS, ROUTINE W REFLEX MICROSCOPIC
Bilirubin Urine: NEGATIVE
Glucose, UA: NEGATIVE mg/dL
HGB URINE DIPSTICK: NEGATIVE
Ketones, ur: NEGATIVE mg/dL
Leukocytes, UA: NEGATIVE
Nitrite: NEGATIVE
Protein, ur: NEGATIVE mg/dL
Specific Gravity, Urine: 1.021 (ref 1.005–1.030)
pH: 6 (ref 5.0–8.0)

## 2018-09-19 LAB — ABO/RH: ABO/RH(D): A POS

## 2018-09-20 ENCOUNTER — Telehealth: Payer: Self-pay | Admitting: Gynecologic Oncology

## 2018-09-20 NOTE — Telephone Encounter (Signed)
Returned call to patient. Unable to leave a message.  Number states at the end of the call that it is not in service.  Was able to reach patient at work.  All questions answered related to surgery.  Due to concerns about the ensure drink, pt advised she did not have to drink it.  Advised to call for any needs.

## 2018-09-22 ENCOUNTER — Ambulatory Visit: Payer: BLUE CROSS/BLUE SHIELD | Admitting: Rheumatology

## 2018-09-26 NOTE — Anesthesia Preprocedure Evaluation (Addendum)
Anesthesia Evaluation  Patient identified by MRN, date of birth, ID band Patient awake    Reviewed: Allergy & Precautions, NPO status   Airway Mallampati: II  TM Distance: >3 FB     Dental   Pulmonary asthma ,    breath sounds clear to auscultation       Cardiovascular  Rhythm:Regular Rate:Normal  History noted. CG   Neuro/Psych    GI/Hepatic Neg liver ROS,   Endo/Other  negative endocrine ROS  Renal/GU negative Renal ROS     Musculoskeletal  (+) Arthritis ,   Abdominal   Peds  Hematology  (+) anemia ,   Anesthesia Other Findings   Reproductive/Obstetrics                            Anesthesia Physical Anesthesia Plan  ASA: III  Anesthesia Plan: General   Post-op Pain Management:    Induction: Intravenous  PONV Risk Score and Plan: Ondansetron, Dexamethasone and Midazolam  Airway Management Planned:   Additional Equipment:   Intra-op Plan:   Post-operative Plan: Possible Post-op intubation/ventilation  Informed Consent: I have reviewed the patients History and Physical, chart, labs and discussed the procedure including the risks, benefits and alternatives for the proposed anesthesia with the patient or authorized representative who has indicated his/her understanding and acceptance.     Plan Discussed with: CRNA and Anesthesiologist  Anesthesia Plan Comments:         Anesthesia Quick Evaluation

## 2018-09-27 ENCOUNTER — Ambulatory Visit (HOSPITAL_COMMUNITY)
Admission: RE | Admit: 2018-09-27 | Discharge: 2018-09-27 | Disposition: A | Discharge status: Home / Self Care | Payer: BLUE CROSS/BLUE SHIELD | Source: Ambulatory Visit | Attending: Gynecologic Oncology | Admitting: Gynecologic Oncology

## 2018-09-27 ENCOUNTER — Encounter (HOSPITAL_COMMUNITY): Payer: Self-pay | Admitting: *Deleted

## 2018-09-27 ENCOUNTER — Ambulatory Visit (HOSPITAL_COMMUNITY): Payer: BLUE CROSS/BLUE SHIELD | Admitting: Anesthesiology

## 2018-09-27 ENCOUNTER — Encounter (HOSPITAL_COMMUNITY)
Admission: RE | Disposition: A | Discharge status: Home / Self Care | Payer: Self-pay | Source: Ambulatory Visit | Attending: Gynecologic Oncology

## 2018-09-27 ENCOUNTER — Other Ambulatory Visit: Payer: Self-pay

## 2018-09-27 DIAGNOSIS — N83202 Unspecified ovarian cyst, left side: Secondary | ICD-10-CM

## 2018-09-27 DIAGNOSIS — N80109 Endometriosis of ovary, unspecified side, unspecified depth: Secondary | ICD-10-CM

## 2018-09-27 DIAGNOSIS — N736 Female pelvic peritoneal adhesions (postinfective): Secondary | ICD-10-CM | POA: Insufficient documentation

## 2018-09-27 DIAGNOSIS — N800 Endometriosis of uterus: Secondary | ICD-10-CM | POA: Insufficient documentation

## 2018-09-27 DIAGNOSIS — Z79899 Other long term (current) drug therapy: Secondary | ICD-10-CM | POA: Insufficient documentation

## 2018-09-27 DIAGNOSIS — Z7982 Long term (current) use of aspirin: Secondary | ICD-10-CM | POA: Diagnosis not present

## 2018-09-27 DIAGNOSIS — N801 Endometriosis of ovary: Secondary | ICD-10-CM | POA: Insufficient documentation

## 2018-09-27 DIAGNOSIS — D259 Leiomyoma of uterus, unspecified: Secondary | ICD-10-CM | POA: Insufficient documentation

## 2018-09-27 HISTORY — PX: ROBOTIC ASSISTED BILATERAL SALPINGO OOPHERECTOMY: SHX6078

## 2018-09-27 LAB — TYPE AND SCREEN
ABO/RH(D): A POS
Antibody Screen: NEGATIVE

## 2018-09-27 SURGERY — SALPINGO-OOPHORECTOMY, BILATERAL, ROBOT-ASSISTED
Anesthesia: General | Laterality: Left

## 2018-09-27 MED ORDER — BUPIVACAINE-EPINEPHRINE (PF) 0.25% -1:200000 IJ SOLN
INTRAMUSCULAR | Status: DC | PRN
  Administered 2018-09-27: 24 mL

## 2018-09-27 MED ORDER — SCOPOLAMINE 1 MG/3DAYS TD PT72: 1.0000 | MEDICATED_PATCH | TRANSDERMAL | Status: DC

## 2018-09-27 MED ORDER — SUGAMMADEX SODIUM 200 MG/2ML IV SOLN
INTRAVENOUS | Status: AC
  Filled 2018-09-27: qty 2

## 2018-09-27 MED ORDER — SODIUM CHLORIDE 0.9% FLUSH: 3.0000 mL | INTRAVENOUS | Status: DC | PRN

## 2018-09-27 MED ORDER — BUPIVACAINE-EPINEPHRINE (PF) 0.25% -1:200000 IJ SOLN
INTRAMUSCULAR | Status: AC
  Filled 2018-09-27: qty 30

## 2018-09-27 MED ORDER — MIDAZOLAM HCL 2 MG/2ML IJ SOLN
INTRAMUSCULAR | Status: AC
  Filled 2018-09-27: qty 2

## 2018-09-27 MED ORDER — SODIUM CHLORIDE 0.9 % IV SOLN: 250.0000 mL | INTRAVENOUS | Status: DC | PRN

## 2018-09-27 MED ORDER — LIDOCAINE 2% (20 MG/ML) 5 ML SYRINGE
INTRAMUSCULAR | Status: DC | PRN
  Administered 2018-09-27: 100 mg via INTRAVENOUS

## 2018-09-27 MED ORDER — SUGAMMADEX SODIUM 200 MG/2ML IV SOLN
INTRAVENOUS | Status: DC | PRN
  Administered 2018-09-27: 200 mg via INTRAVENOUS

## 2018-09-27 MED ORDER — PROPOFOL 10 MG/ML IV BOLUS
INTRAVENOUS | Status: AC
  Filled 2018-09-27: qty 20

## 2018-09-27 MED ORDER — ACETAMINOPHEN 500 MG PO TABS
1000.0000 mg | ORAL_TABLET | ORAL | Status: AC
  Administered 2018-09-27: 1000 mg via ORAL
  Filled 2018-09-27: qty 2

## 2018-09-27 MED ORDER — MORPHINE SULFATE (PF) 4 MG/ML IV SOLN: 2.0000 mg | INTRAVENOUS | Status: DC | PRN

## 2018-09-27 MED ORDER — OXYCODONE-ACETAMINOPHEN 5-325 MG PO TABS: 1.0000 | ORAL_TABLET | ORAL | 0 refills | Status: AC | PRN

## 2018-09-27 MED ORDER — FENTANYL CITRATE (PF) 250 MCG/5ML IJ SOLN
INTRAMUSCULAR | Status: DC | PRN
  Administered 2018-09-27 (×2): 50 ug via INTRAVENOUS
  Administered 2018-09-27: 150 ug via INTRAVENOUS

## 2018-09-27 MED ORDER — ROCURONIUM BROMIDE 10 MG/ML (PF) SYRINGE
PREFILLED_SYRINGE | INTRAVENOUS | Status: DC | PRN
  Administered 2018-09-27: 50 mg via INTRAVENOUS

## 2018-09-27 MED ORDER — KETOROLAC TROMETHAMINE 15 MG/ML IJ SOLN
INTRAMUSCULAR | Status: AC
  Filled 2018-09-27: qty 1

## 2018-09-27 MED ORDER — ONDANSETRON HCL 4 MG/2ML IJ SOLN
INTRAMUSCULAR | Status: AC
  Filled 2018-09-27: qty 2

## 2018-09-27 MED ORDER — ENOXAPARIN SODIUM 40 MG/0.4ML ~~LOC~~ SOLN
40.0000 mg | SUBCUTANEOUS | Status: AC
  Administered 2018-09-27: 40 mg via SUBCUTANEOUS
  Filled 2018-09-27: qty 0.4

## 2018-09-27 MED ORDER — DEXAMETHASONE SODIUM PHOSPHATE 4 MG/ML IJ SOLN: 4.0000 mg | INTRAMUSCULAR | Status: DC

## 2018-09-27 MED ORDER — DEXAMETHASONE SODIUM PHOSPHATE 10 MG/ML IJ SOLN
INTRAMUSCULAR | Status: AC
  Filled 2018-09-27: qty 1

## 2018-09-27 MED ORDER — LIDOCAINE 2% (20 MG/ML) 5 ML SYRINGE
INTRAMUSCULAR | Status: AC
  Filled 2018-09-27: qty 5

## 2018-09-27 MED ORDER — GABAPENTIN 300 MG PO CAPS
300.0000 mg | ORAL_CAPSULE | ORAL | Status: AC
  Administered 2018-09-27: 300 mg via ORAL
  Filled 2018-09-27: qty 1

## 2018-09-27 MED ORDER — MIDAZOLAM HCL 5 MG/5ML IJ SOLN
INTRAMUSCULAR | Status: DC | PRN
  Administered 2018-09-27: 2 mg via INTRAVENOUS

## 2018-09-27 MED ORDER — SODIUM CHLORIDE 0.9% FLUSH: 3.0000 mL | Freq: Two times a day (BID) | INTRAVENOUS | Status: DC

## 2018-09-27 MED ORDER — FENTANYL CITRATE (PF) 250 MCG/5ML IJ SOLN
INTRAMUSCULAR | Status: AC
  Filled 2018-09-27: qty 5

## 2018-09-27 MED ORDER — ONDANSETRON HCL 4 MG/2ML IJ SOLN
INTRAMUSCULAR | Status: DC | PRN
  Administered 2018-09-27: 4 mg via INTRAVENOUS

## 2018-09-27 MED ORDER — ROCURONIUM BROMIDE 10 MG/ML (PF) SYRINGE
PREFILLED_SYRINGE | INTRAVENOUS | Status: AC
  Filled 2018-09-27: qty 10

## 2018-09-27 MED ORDER — PROPOFOL 10 MG/ML IV BOLUS
INTRAVENOUS | Status: DC | PRN
  Administered 2018-09-27: 160 mg via INTRAVENOUS

## 2018-09-27 MED ORDER — KETOROLAC TROMETHAMINE 15 MG/ML IJ SOLN
15.0000 mg | Freq: Four times a day (QID) | INTRAMUSCULAR | Status: DC
  Administered 2018-09-27: 15 mg via INTRAVENOUS

## 2018-09-27 MED ORDER — SCOPOLAMINE 1 MG/3DAYS TD PT72
1.0000 | MEDICATED_PATCH | TRANSDERMAL | Status: DC
  Administered 2018-09-27: 1.5 mg via TRANSDERMAL
  Filled 2018-09-27: qty 1

## 2018-09-27 MED ORDER — LACTATED RINGERS IR SOLN
Status: DC | PRN
  Administered 2018-09-27: 1000 mL

## 2018-09-27 MED ORDER — LACTATED RINGERS IV SOLN
INTRAVENOUS | Status: DC
  Administered 2018-09-27: 07:00:00 via INTRAVENOUS

## 2018-09-27 MED ORDER — STERILE WATER FOR IRRIGATION IR SOLN
Status: DC | PRN
  Administered 2018-09-27: 1000 mL

## 2018-09-27 MED ORDER — DEXAMETHASONE SODIUM PHOSPHATE 10 MG/ML IJ SOLN
INTRAMUSCULAR | Status: DC | PRN
  Administered 2018-09-27: 10 mg via INTRAVENOUS

## 2018-09-27 MED ORDER — ACETAMINOPHEN 325 MG PO TABS: 650.0000 mg | ORAL_TABLET | ORAL | Status: DC | PRN

## 2018-09-27 MED ORDER — FENTANYL CITRATE (PF) 100 MCG/2ML IJ SOLN
INTRAMUSCULAR | Status: AC
  Filled 2018-09-27: qty 4

## 2018-09-27 MED ORDER — OXYCODONE HCL 5 MG PO TABS: 5.0000 mg | ORAL_TABLET | ORAL | Status: DC | PRN

## 2018-09-27 MED ORDER — CEFAZOLIN SODIUM-DEXTROSE 2-4 GM/100ML-% IV SOLN
2.0000 g | INTRAVENOUS | Status: AC
  Administered 2018-09-27: 2 g via INTRAVENOUS
  Filled 2018-09-27: qty 100

## 2018-09-27 MED ORDER — FENTANYL CITRATE (PF) 100 MCG/2ML IJ SOLN
25.0000 ug | INTRAMUSCULAR | Status: DC | PRN
  Administered 2018-09-27 (×2): 50 ug via INTRAVENOUS

## 2018-09-27 MED ORDER — ACETAMINOPHEN 650 MG RE SUPP
650.0000 mg | RECTAL | Status: DC | PRN
  Filled 2018-09-27: qty 1

## 2018-09-27 SURGICAL SUPPLY — 55 items
ADH SKN CLS APL DERMABOND .7 (GAUZE/BANDAGES/DRESSINGS) ×1
AGENT HMST KT MTR STRL THRMB (HEMOSTASIS)
APL ESCP 34 STRL LF DISP (HEMOSTASIS)
APPLICATOR SURGIFLO ENDO (HEMOSTASIS) IMPLANT
BAG LAPAROSCOPIC 12 15 PORT 16 (BASKET) IMPLANT
BAG RETRIEVAL 12/15 (BASKET)
BAG SPEC RTRVL LRG 6X4 10 (ENDOMECHANICALS) ×1
COVER BACK TABLE 60X90IN (DRAPES) ×2 IMPLANT
COVER TIP SHEARS 8 DVNC (MISCELLANEOUS) ×1 IMPLANT
COVER TIP SHEARS 8MM DA VINCI (MISCELLANEOUS) ×1
COVER WAND RF STERILE (DRAPES) ×2 IMPLANT
DERMABOND ADVANCED (GAUZE/BANDAGES/DRESSINGS) ×1
DERMABOND ADVANCED .7 DNX12 (GAUZE/BANDAGES/DRESSINGS) ×1 IMPLANT
DRAPE ARM DVNC X/XI (DISPOSABLE) ×4 IMPLANT
DRAPE COLUMN DVNC XI (DISPOSABLE) ×1 IMPLANT
DRAPE DA VINCI XI ARM (DISPOSABLE) ×4
DRAPE DA VINCI XI COLUMN (DISPOSABLE) ×1
DRAPE SHEET LG 3/4 BI-LAMINATE (DRAPES) ×2 IMPLANT
DRAPE SURG IRRIG POUCH 19X23 (DRAPES) ×2 IMPLANT
ELECT REM PT RETURN 15FT ADLT (MISCELLANEOUS) ×2 IMPLANT
GAUZE SPONGE 4X4 16PLY XRAY LF (GAUZE/BANDAGES/DRESSINGS) ×1 IMPLANT
GLOVE BIO SURGEON STRL SZ 6 (GLOVE) ×8 IMPLANT
GLOVE BIO SURGEON STRL SZ 6.5 (GLOVE) IMPLANT
GOWN STRL REUS W/ TWL LRG LVL3 (GOWN DISPOSABLE) ×2 IMPLANT
GOWN STRL REUS W/TWL LRG LVL3 (GOWN DISPOSABLE) ×4
HOLDER FOLEY CATH W/STRAP (MISCELLANEOUS) ×2 IMPLANT
IRRIG SUCT STRYKERFLOW 2 WTIP (MISCELLANEOUS) ×2
IRRIGATION SUCT STRKRFLW 2 WTP (MISCELLANEOUS) ×1 IMPLANT
KIT PROCEDURE DA VINCI SI (MISCELLANEOUS)
KIT PROCEDURE DVNC SI (MISCELLANEOUS) IMPLANT
MANIPULATOR UTERINE 4.5 ZUMI (MISCELLANEOUS) ×2 IMPLANT
NDL SPNL 18GX3.5 QUINCKE PK (NEEDLE) IMPLANT
NEEDLE HYPO 22GX1.5 SAFETY (NEEDLE) ×2 IMPLANT
NEEDLE SPNL 18GX3.5 QUINCKE PK (NEEDLE) IMPLANT
OBTURATOR OPTICAL STANDARD 8MM (TROCAR) ×1
OBTURATOR OPTICAL STND 8 DVNC (TROCAR) ×1
OBTURATOR OPTICALSTD 8 DVNC (TROCAR) ×1 IMPLANT
PACK ROBOT GYN CUSTOM WL (TRAY / TRAY PROCEDURE) ×2 IMPLANT
PAD POSITIONING PINK XL (MISCELLANEOUS) ×2 IMPLANT
PORT ACCESS TROCAR AIRSEAL 12 (TROCAR) ×1 IMPLANT
PORT ACCESS TROCAR AIRSEAL 5M (TROCAR) ×1
POUCH SPECIMEN RETRIEVAL 10MM (ENDOMECHANICALS) ×1 IMPLANT
SEAL CANN UNIV 5-8 DVNC XI (MISCELLANEOUS) ×4 IMPLANT
SEAL XI 5MM-8MM UNIVERSAL (MISCELLANEOUS) ×4
SET TRI-LUMEN FLTR TB AIRSEAL (TUBING) ×2 IMPLANT
SURGIFLO W/THROMBIN 8M KIT (HEMOSTASIS) IMPLANT
SUT MNCRL AB 4-0 PS2 18 (SUTURE) ×2 IMPLANT
SUT VIC AB 0 CT1 27 (SUTURE)
SUT VIC AB 0 CT1 27XBRD ANTBC (SUTURE) IMPLANT
SYR 10ML LL (SYRINGE) IMPLANT
TOWEL OR NON WOVEN STRL DISP B (DISPOSABLE) ×2 IMPLANT
TRAP SPECIMEN MUCOUS 40CC (MISCELLANEOUS) IMPLANT
TRAY FOLEY MTR SLVR 16FR STAT (SET/KITS/TRAYS/PACK) ×2 IMPLANT
UNDERPAD 30X30 (UNDERPADS AND DIAPERS) ×2 IMPLANT
WATER STERILE IRR 1000ML POUR (IV SOLUTION) ×2 IMPLANT

## 2018-09-27 NOTE — Anesthesia Postprocedure Evaluation (Signed)
Anesthesia Post Note  Patient: Kara Carey  Procedure(s) Performed: XI ROBOTIC ASSISTED LEFT SALPINGO OOPHORECTOMY (Left )     Patient location during evaluation: PACU Anesthesia Type: General Level of consciousness: awake Pain management: pain level controlled Vital Signs Assessment: post-procedure vital signs reviewed and stable Respiratory status: spontaneous breathing Cardiovascular status: stable Anesthetic complications: no    Last Vitals:  Vitals:   09/27/18 1130 09/27/18 1205  BP: (!) 125/56 (!) 126/58  Pulse: 68 68  Resp: 14 14  Temp:    SpO2: 98% 98%    Last Pain:  Vitals:   09/27/18 1205  PainSc: 0-No pain                 Tymeka Privette

## 2018-09-27 NOTE — Anesthesia Procedure Notes (Signed)
Procedure Name: Intubation Date/Time: 09/27/2018 7:37 AM Performed by: Talbot Grumbling, CRNA Pre-anesthesia Checklist: Patient identified, Emergency Drugs available, Suction available and Patient being monitored Patient Re-evaluated:Patient Re-evaluated prior to induction Oxygen Delivery Method: Circle system utilized Preoxygenation: Pre-oxygenation with 100% oxygen Induction Type: IV induction Ventilation: Mask ventilation without difficulty Laryngoscope Size: Mac and 3 Grade View: Grade I Tube type: Oral Tube size: 7.5 mm Number of attempts: 1 Airway Equipment and Method: Stylet Placement Confirmation: ETT inserted through vocal cords under direct vision,  positive ETCO2 and breath sounds checked- equal and bilateral Secured at: 21 cm Tube secured with: Tape Dental Injury: Teeth and Oropharynx as per pre-operative assessment

## 2018-09-27 NOTE — Transfer of Care (Signed)
Immediate Anesthesia Transfer of Care Note  Patient: Kara Carey  Procedure(s) Performed: XI ROBOTIC ASSISTED LEFT SALPINGO OOPHORECTOMY (Left )  Patient Location: PACU  Anesthesia Type:General  Level of Consciousness: awake, alert  and oriented  Airway & Oxygen Therapy: Patient Spontanous Breathing and Patient connected to face mask oxygen  Post-op Assessment: Report given to RN and Post -op Vital signs reviewed and stable  Post vital signs: Reviewed and stable  Last Vitals:  Vitals Value Taken Time  BP    Temp    Pulse    Resp    SpO2      Last Pain: There were no vitals filed for this visit.       Complications: No apparent anesthesia complications

## 2018-09-27 NOTE — Interval H&P Note (Signed)
History and Physical Interval Note:  09/27/2018 7:13 AM  Kara Carey  has presented today for surgery, with the diagnosis of LEFT OVARIAN CYST  The various methods of treatment have been discussed with the patient and family. After consideration of risks, benefits and other options for treatment, the patient has consented to  Procedure(s): XI ROBOTIC ASSISTED UNILATERAL SALPINGO OOPHORECTOMY, POSSIBLE TOTAL HYSTERECTOMY, POSSIBLE STAGING (N/A) as a surgical intervention .  The patient's history has been reviewed, patient examined, no change in status, stable for surgery.  I have reviewed the patient's chart and labs.  Questions were answered to the patient's satisfaction.     Thereasa Solo

## 2018-09-27 NOTE — Op Note (Signed)
OPERATIVE NOTE  Date: 09/27/18  Preoperative Diagnosis: left ovarian cyst   Postoperative Diagnosis:  Left ovarian endometriotic cyst, stage IV endometriosis, dense pelvic adhesions  Procedure(s) Performed: Robotic-assisted laparoscopic left salpingo-oophorectomy, lysis of pelvic adhesions  Surgeon: Everitt Amber, M.D.  Assistant Surgeon: Lahoma Crocker M.D. (an MD assistant was necessary for tissue manipulation, management of robotic instrumentation, retraction and positioning due to the complexity of the case and hospital policies).   Anesthesia: Gen. endotracheal.  Specimens: Left ovary fallopian tube, pelvic washings  Estimated Blood Loss: <10 mL. Blood Replacement: None  Complications: none  Indication for Procedure:  Left ovarian cyst seen on MRI. Asymptomatic  Operative Findings: 8cm left ovarian cyst filled with old blood (thick), densely adherent to left ovarian fossa, rectum and posterior uterus. Obliterated cul de sac with rectal adhesions to posterior uterus. The right ovary was grossly normal, but there were adhesions between the tube and uterus and ovary.   [place] pathology was consistent with benign endometrioma.  Procedure: The patient's taken to the operating room and placed under general endotracheal anesthesia testing difficulty. She is placed in a dorsolithotomy position and cervical acromial pad was placed. The arms were tucked with care taken to pad the olecranon process. And prepped and draped in usual sterile fashion. A uterine manipulator (zumi) was placed vaginally. A 13mm incision was made in the left upper quadrant palmer's point and a 5 mm Optiview trocar used to enter the abdomen under direct visualization. With entry into the abdomen and then maintenance of 15 mm of mercury the patient was placed in Trendelenburg position. An incision was made in the umbilicus and a 70JG trochar was placed through this site. Two incisions were made lateral to the umbilical  incision in the left and right abdomen measuring 44mm. These incisions were made approximately 10 cm lateral to the umbilical incision. 8 mm robotic trochars were inserted. The robot was docked.  The abdomen was inspected as was the pelvis.  Pelvic washings were obtained. For 30 minutes sharp adhesiolysis was performed to separate the left ovary from the rectum and posterior uterus and left ovarian fossa. In the process of this adhesiolysis there was unavoidable rupture of the cyst for thick old blood.   The left peritoneum and the side wall was incised, and the retroperitoneal space entered. The left ureter was identified and the left pararectal space was developed. The utero-ovarian ligament was skeletonized cauterized and transected. The left utero-ovarian ligaments were cauterized and transected in the left adnexa was placed in an Endo Catch bag.  The abdomen was copiously irrigated and drained and all operative sites inspected and hemostasis was assured  The robot was undocked.The contents of the left Endo Catch bag were first aspirated and then morcellated to facilitate removal from the abdominal cavity through the LUQ incision. In a similar fashion the contents of the right Endo Catch bag or morcellated to facilitate removal from the abdominal cavity.  The ports were all remove. The fascial closure at the umbilical incision and left upper quadrant port was made with 0 Vicryl.  All incisions were closed with a running subcuticular Monocryl suture. Dermabond was applied. Sponge, lap and needle counts were correct x 3.    The patient had did not have sequential compression devices for VTE prophylaxis as none were available in the hospital.         Disposition: PACU stable         Condition: stable  Donaciano Eva, MD

## 2018-09-27 NOTE — Discharge Instructions (Signed)
General Anesthesia, Adult, Care After  These instructions provide you with information about caring for yourself after your procedure. Your health care provider may also give you more specific instructions. Your treatment has been planned according to current medical practices, but problems sometimes occur. Call your health care provider if you have any problems or questions after your procedure. What can I expect after the procedure? After the procedure, it is common to have:  Vomiting.  A sore throat.  Mental slowness.  It is common to feel:  Nauseous.  Cold or shivery.  Sleepy.  Tired.  Sore or achy, even in parts of your body where you did not have surgery.  Follow these instructions at home: For at least 24 hours after the procedure:  Do not: ? Participate in activities where you could fall or become injured. ? Drive. ? Use heavy machinery. ? Drink alcohol. ? Take sleeping pills or medicines that cause drowsiness. ? Make important decisions or sign legal documents. ? Take care of children on your own.  Rest. Eating and drinking  If you vomit, drink water, juice, or soup when you can drink without vomiting.  Drink enough fluid to keep your urine clear or pale yellow.  Make sure you have little or no nausea before eating solid foods.  Follow the diet recommended by your health care provider. General instructions  Have a responsible adult stay with you until you are awake and alert.  Return to your normal activities as told by your health care provider. Ask your health care provider what activities are safe for you.  Take over-the-counter and prescription medicines only as told by your health care provider.  If you smoke, do not smoke without supervision.  Keep all follow-up visits as told by your health care provider. This is important. Contact a health care provider if:  You continue to have nausea or vomiting at home, and medicines are not  helpful.  You cannot drink fluids or start eating again.  You cannot urinate after 8-12 hours.  You develop a skin rash.  You have fever.  You have increasing redness at the site of your procedure. Get help right away if:  You have difficulty breathing.  You have chest pain.  You have unexpected bleeding.  You feel that you are having a life-threatening or urgent problem. This information is not intended to replace advice given to you by your health care provider. Make sure you discuss any questions you have with your health care provider. Document Released: 01/11/2001 Document Revised: 03/09/2016 Document Reviewed: 09/19/2015 Elsevier Interactive Patient Education  2018 [place].     Unilateral Salpingo-Oophorectomy, Care After Refer to this sheet in the next few weeks. These instructions provide you with information on caring for yourself after your procedure. Your health care provider may also give you more specific instructions. Your treatment has been planned according to current medical practices, but problems sometimes occur. Call your health care provider if you have any problems or questions after your procedure. What can I expect after the procedure? After the procedure, it is typical to have the following:  Abdominal pain that can be controlled with pain medicine.  Vaginal spotting.  Constipation. Medications:  - Take ibuprofen and tylenol first line for pain control. Take these regularly (every 6 hours) to decrease the build up of pain.  - If necessary, for severe pain not relieved by ibuprofen, take percocet.  - While taking percocet you should take sennakot every night to reduce  the likelihood of constipation. If this causes diarrhea, stop its use.  Follow these instructions at home:  Get plenty of rest and sleep.  Only take over-the-counter or prescription medicines as directed by your health care provider. Do not take aspirin. It can cause  bleeding.  Keep incision areas clean and dry. Remove or change any bandages (dressings) only as directed by your health care provider.  Follow your health care provider's advice regarding diet.  Drink enough fluids to keep your urine clear or pale yellow.  Limit exercise and activities as directed by your health care provider. Do not lift anything heavier than 5 pounds (2.3 kg) until your health care provider approves.  Do not drive until your health care provider approves.  Do not drink alcohol until your health care provider approves.  Do not have sexual intercourse until your health care provider says it is OK.  Take your temperature twice a day and write it down.  If you become constipated, you may: ? Ask your health care provider about taking a mild laxative. ? Add more fruit and bran to your diet. ? Drink more fluids.  Follow up with your health care provider as directed. Contact a health care provider if:  You have swelling or redness in the incision area.  You develop a rash.  You feel lightheaded.  You have pain that is not controlled with medicine.  You have pain, swelling, or redness where the IV access tube was placed. Get help right away if:  You have a fever.  You develop increasing abdominal pain.  You see pus coming out of the incision, or the incision is separating.  You notice a bad smell coming from the wound or dressing.  You have excessive vaginal bleeding.  You feel sick to your stomach (nauseous) and vomit.  You have leg or chest pain.  You have pain when you urinate.  You develop shortness of breath.  You pass out. This information is not intended to replace advice given to you by your health care provider. Make sure you discuss any questions you have with your health care provider. Document Released: 08/01/2009 Document Revised: 09/04/2016 Document Reviewed: 03/29/2013 Elsevier Interactive Patient Education  [employer].

## 2018-09-29 ENCOUNTER — Encounter (HOSPITAL_COMMUNITY): Payer: Self-pay | Admitting: Gynecologic Oncology

## 2018-09-29 ENCOUNTER — Ambulatory Visit: Payer: BLUE CROSS/BLUE SHIELD | Admitting: Obstetrics & Gynecology

## 2018-09-30 ENCOUNTER — Telehealth: Payer: Self-pay

## 2018-09-30 NOTE — Telephone Encounter (Signed)
Out going call to patient per Joylene John NP regarding surgical path report as "benign" and "how is she doing?"  Pt reports she is doing well, denies any issues at this time, taking pain med sparingly and alternating with Ibuprofen.  She had questions about going back to work and driving.  Per Melissa NP, pt can go back in 2 weeks to work or sooner, she can call us and we will send her work a note to return.  Also, she can drive in 3 days from surgery if not taking pain medications and if she feels up to it, based on how she feels - pt voiced understanding.  Confirmed her appt for f/u in January.  Reminded her to call our office number if any questions or concerns prior.  Voiced understanding and no other needs per pt at this time.

## 2018-10-03 ENCOUNTER — Telehealth: Payer: Self-pay | Admitting: Gynecologic Oncology

## 2018-10-03 NOTE — Telephone Encounter (Signed)
Returned call to patient.  All questions answered. Patient planning on returning to work tomorrow.  Advised to call for any needs or concerns.

## 2018-10-26 ENCOUNTER — Inpatient Hospital Stay: Payer: BLUE CROSS/BLUE SHIELD | Attending: Gynecologic Oncology | Admitting: Gynecologic Oncology

## 2018-10-26 ENCOUNTER — Encounter: Payer: Self-pay | Admitting: Gynecologic Oncology

## 2018-10-26 VITALS — BP 154/78 | HR 90 | Temp 98.0°F | Resp 20 | Ht 63.5 in | Wt 192.2 lb

## 2018-10-26 DIAGNOSIS — Z90721 Acquired absence of ovaries, unilateral: Secondary | ICD-10-CM | POA: Diagnosis not present

## 2018-10-26 DIAGNOSIS — N83202 Unspecified ovarian cyst, left side: Secondary | ICD-10-CM | POA: Diagnosis present

## 2018-10-26 NOTE — Progress Notes (Signed)
Follow-up Note: Gyn-Onc  Consult was requested by Dr. Radene Knee for the evaluation of Kara Carey 58 y.o. female  CC:  Chief Complaint  Patient presents with  . Left ovarian cyst    Assessment/Plan:  Ms. Kara Carey  is a 58 y.o.  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  She is doing well postop with no issues.   HPI: Kara Carey is a 58 year old P0 who was seen in consultation at the request of Dr. Radene Knee for a 7.7 cm left ovarian mass.  The patient is asymptomatic with the exception of chronic sacroiliac joint discomfort which she was evaluated with an MRI of the sacrum and SI joints on 08/27/2018.  This revealed an incidentally found 7.8 x 7.4 x 5.4 cm lesion associated with the left ovary with an increased T1 signal intensity, which is felt to be most likely a large hemorrhagic cyst or endometrioma.  The uterus was small but multiple small fibroids were noted.  The right ovary appeared grossly normal on MR.  She was then seen by Dr. Radene Knee who performed a transvaginal ultrasound scan which revealed a uterus measuring 6.3 x 4.07 x 4 cm with an endometrial thickness of 2 mm.  The right ovary was not visualized in the left ovary measured 7.7 x 6.3 x 8.5 cm with no flow seen to the mass was felt to be either an ovarian fibroma versus an endometrioma and there was no free fluid in the cul-de-sac.  Ca1 25 drawn on September 01, 2018 was normal at 14.  The patient is otherwise a fairly healthy woman.  She has chronic arthritis with concern for potential underlying ankylosing spondylitis.  She is never been pregnant.  She is never had surgery.  She does not take routine medications other than Tylenol and biotin.  She underwent natural menopause at approximately age 83.  Her family history significant for mother who diagnosed with breast cancer at age 20 and a father who was diagnosed with lymphoma.  She works  in Agricultural consultant.  She is married.  Her last pap was normal in November 2019 with no high risk HPV detected.  Interval Hx: On 09/27/18 she underwent robotic LSO with lysis of adhsions.  She did well postop with no issues. Final pathology revealed a benign endometrioma.   Current Meds:  Outpatient Encounter Medications as of 10/26/2018  Medication Sig  . acetaminophen (TYLENOL) 500 MG tablet Take 500-750 mg by mouth every 6 (six) hours as needed for moderate pain or headache.   [name] aspirin EC 81 MG tablet Take 81 mg by mouth daily.  . Azelaic Acid (FINACEA) 15 % cream Apply 1 application topically 2 (two) times daily. After skin is thoroughly washed and patted dry, gently but thoroughly massage a thin film of azelaic acid cream into the affected area twice daily, in the morning and evening.  . cholecalciferol (VITAMIN D3) 25 MCG (1000 UT) tablet Take 1,000 Units by mouth daily.   No facility-administered encounter medications on file as of 10/26/2018.     Allergy:  Allergies  Allergen Reactions  . Sulfa Antibiotics Anaphylaxis  . Clindamycin Diarrhea  . Levofloxacin Other (See Comments)    Unknown  . Quinolones Other (See Comments)    Nerve damage    Social Hx:   Social History   Socioeconomic History  . Marital status: Married    Spouse name: Not on file  . Number  of children: Not on file  . Years of education: Not on file  . Highest education level: Not on file  Occupational History  . Not on file  Social Needs  . Financial resource strain: Not on file  . Food insecurity:    Worry: Not on file    Inability: Not on file  . Transportation needs:    Medical: Not on file    Non-medical: Not on file  Tobacco Use  . Smoking status: Never Smoker  . Smokeless tobacco: Never Used  Substance and Sexual Activity  . Alcohol use: No  . Drug use: No  . Sexual activity: Not on file  Lifestyle  . Physical activity:    Days per week: Not on file    Minutes per session: Not on file  .  Stress: Not on file  Relationships  . Social connections:    Talks on phone: Not on file    Gets together: Not on file    Attends religious service: Not on file    Active member of club or organization: Not on file    Attends meetings of clubs or organizations: Not on file    Relationship status: Not on file  . Intimate partner violence:    Fear of current or ex partner: Not on file    Emotionally abused: Not on file    Physically abused: Not on file    Forced sexual activity: Not on file  Other Topics Concern  . Not on file  Social History Narrative  . Not on file    Past Surgical Hx:  Past Surgical History:  Procedure Laterality Date  . LESION EXCISION  07/2002   left upper arm-local anesthesia  . ROBOTIC ASSISTED BILATERAL SALPINGO OOPHERECTOMY Left 09/27/2018   Procedure: XI ROBOTIC ASSISTED LEFT SALPINGO OOPHORECTOMY;  Surgeon: Everitt Amber, MD;  Location: [facility];  Service: Gynecology;  Laterality: Left;    Past Medical Hx:  Past Medical History:  Diagnosis Date  . Allergy   . Anemia   . Anxiety hx of panic attacks  . Asthma   . Bilateral leg pain   . Family history of adverse reaction to anesthesia    parent's had problem coming out of anesthesia-hard to wake up    Past Gynecological History:  See HPI No LMP recorded. Patient is postmenopausal.  Family Hx:  Family History  Problem Relation Age of Onset  . Heart disease Mother   . Dementia Mother   . Hyperlipidemia Sister   . Asthma Brother   . Lymphoma Father   . Rheum arthritis Father   . Heart disease Father   . Diabetes Father   . Hypertension Sister   . Liver disease Brother     Review of Systems:  Constitutional  Feels well,    ENT Normal appearing ears and nares bilaterally Skin/Breast  No rash, sores, jaundice, itching, dryness Cardiovascular  No chest pain, shortness of breath, or edema  Pulmonary  No cough or wheeze.  Gastro Intestinal  No nausea, vomitting, or diarrhoea. No bright  red blood per rectum, no abdominal pain, change in bowel movement, or constipation.  Genito Urinary  No frequency, urgency, dysuria, no bleeding or pelvic pain Musculo Skeletal  No myalgia, arthralgia, joint swelling or pain  Neurologic  No weakness, numbness, change in gait,  Psychology  No depression, anxiety, insomnia.   Vitals:  Blood pressure (!) 154/78, pulse 90, temperature 98 F (36.7 C), temperature source Oral, resp. rate 20, height  5' 3.5" (1.613 m), weight 192 lb 3.2 oz (87.2 kg), SpO2 98 %.  Physical Exam: WD in NAD Neck  Supple NROM, without any enlargements.  Lymph Node Survey No cervical supraclavicular or inguinal adenopathy Cardiovascular  Pulse normal rate, regularity and rhythm. S1 and S2 normal.  Lungs  Clear to auscultation bilateraly, without wheezes/crackles/rhonchi. Good air movement.  Skin  No rash/lesions/breakdown  Psychiatry  Alert and oriented to person, place, and time  Abdomen  Well healed incisions x 4 Back No CVA tenderness Genito Urinary  deferred Rectal  deferred  Extremities  No bilateral cyanosis, clubbing or edema.   Thereasa Solo, MD  10/26/2018, 2:13 PM

## 2018-10-26 NOTE — Patient Instructions (Signed)
Please let Dr Serita Grit office know if you have any concerns or questions related to your surgery. [place]   Please follow-up annually with Dr Radene Knee for well woman checks.  You are cleared to resume all normal activities on 10/28/17.

## 2018-11-10 IMAGING — MR MR SACRUM / SI JOINTS WO CM
6 series · 48 of 48 positions shown · non-contrast
Comparison: Radiographs 08/17/2018

CLINICAL DATA: Morning low back pain.

EXAM:
MRI PELVIS WITHOUT CONTRAST
TECHNIQUE: Multiplanar multisequence MR imaging of the pelvis was performed. No
intravenous contrast was administered.

[Series 3: T1 · axial · 4.0mm · 1.17mm/px · z∈[-132,+87]mm · 11 of 45 slices shown (1 of 3)]
[im 1/45]
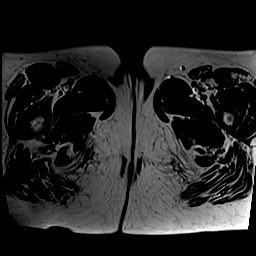
[im 5/45]
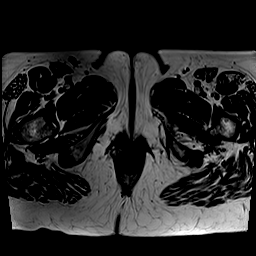
[im 9/45]
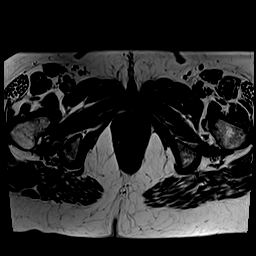
[im 14/45]
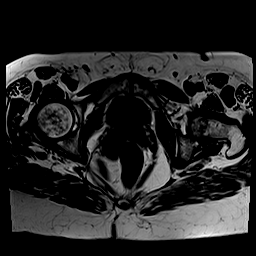
[im 18/45]
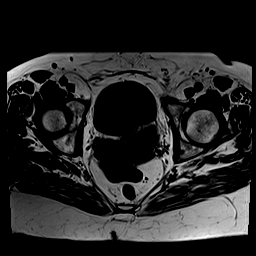
[im 23/45]
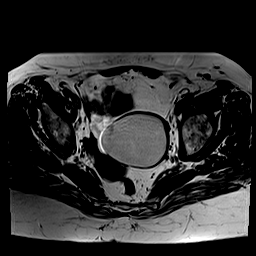
[im 27/45]
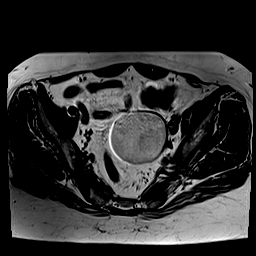
[im 31/45]
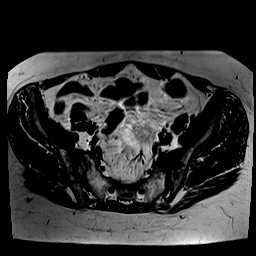
[im 36/45]
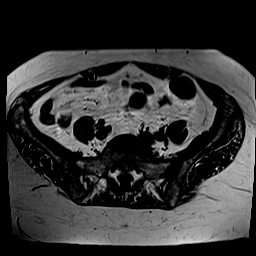
[im 40/45]
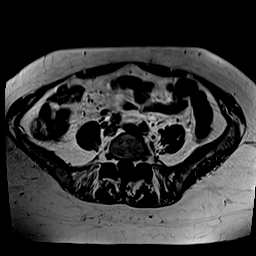
[im 45/45]
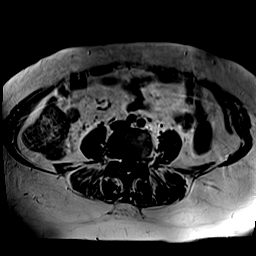

[Series 4: T2 fat-sat · axial · 4.0mm · 1.17mm/px · z∈[-132,+87]mm · 10 of 45 slices shown]
[im 1/45]
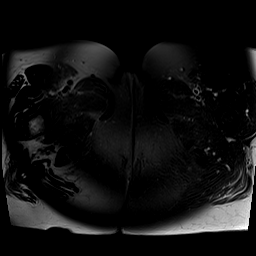
[im 5/45]
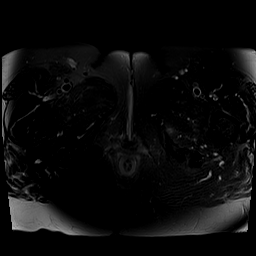
[im 10/45]
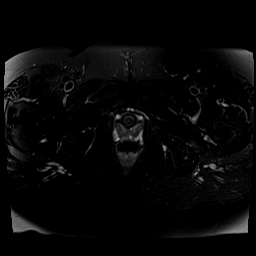
[im 15/45]
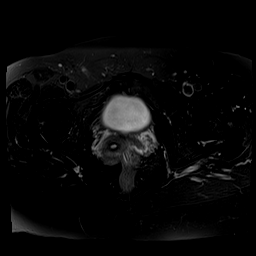
[im 20/45]
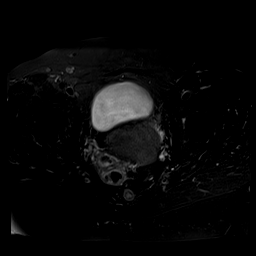
[im 25/45]
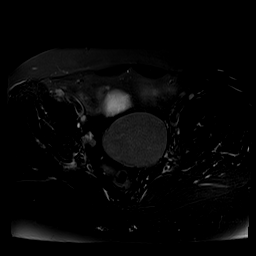
[im 30/45]
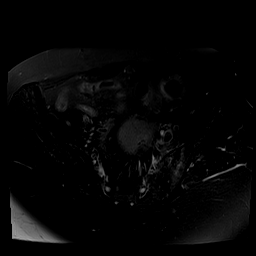
[im 35/45]
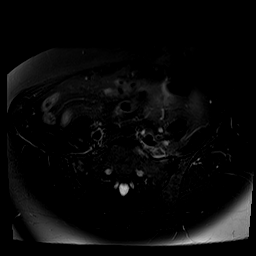
[im 40/45]
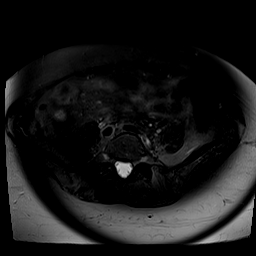
[im 45/45]
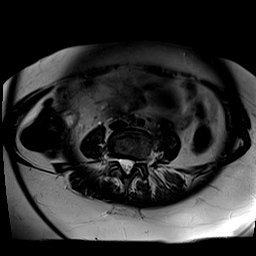

[Series 5: T1 · coronal · 3.0mm · 1.25mm/px · 8 of 38 slices shown (2 of 3)]
[im 1/38]
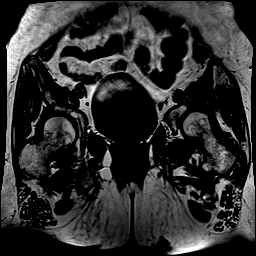
[im 6/38]
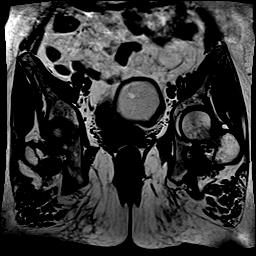
[im 11/38]
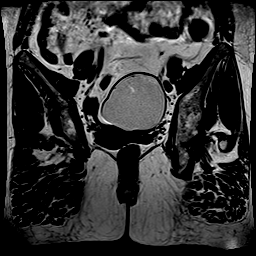
[im 16/38]
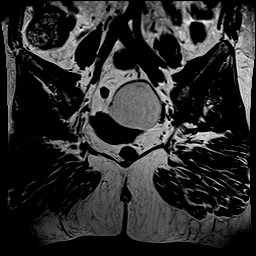
[im 22/38]
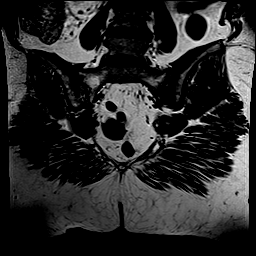
[im 27/38]
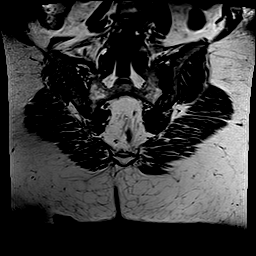
[im 32/38]
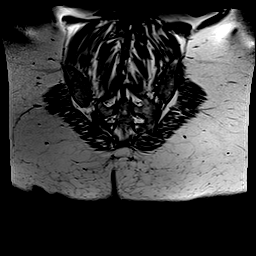
[im 38/38]
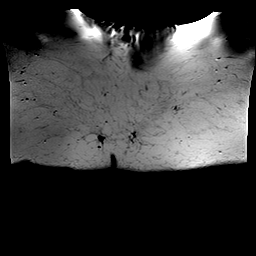

[Series 6: STIR · coronal · 3.0mm · 1.33mm/px · 8 of 38 slices shown]
[im 1/38]
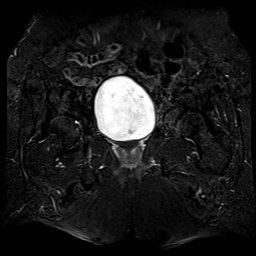
[im 6/38]
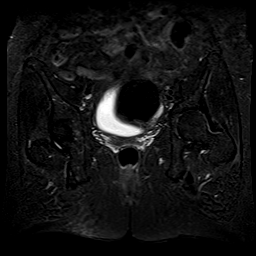
[im 11/38]
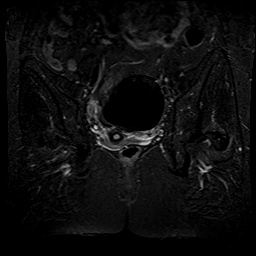
[im 16/38]
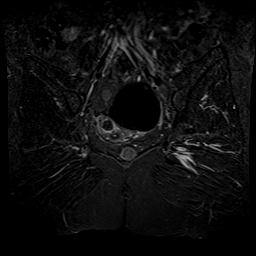
[im 22/38]
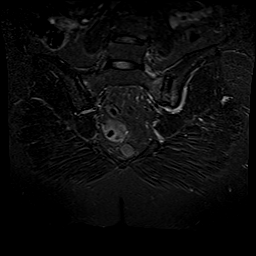
[im 27/38]
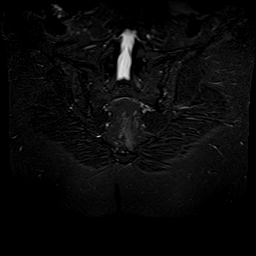
[im 32/38]
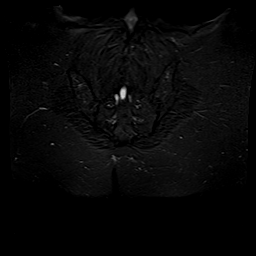
[im 38/38]
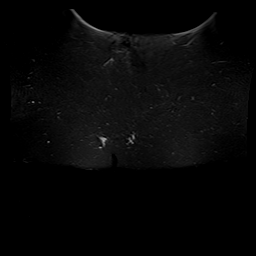

[Series 7: T1 · sagittal · 4.0mm · 0.94mm/px · 8 of 38 slices shown (3 of 3)]
[im 1/38]
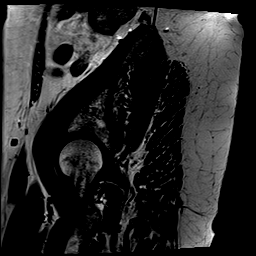
[im 6/38]
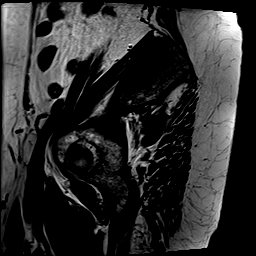
[im 11/38]
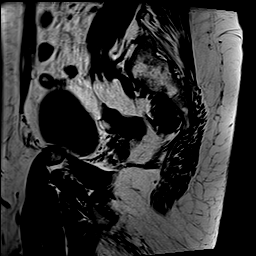
[im 16/38]
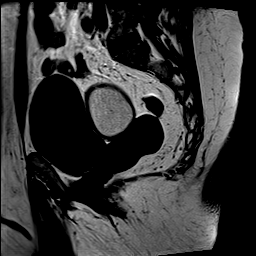
[im 22/38]
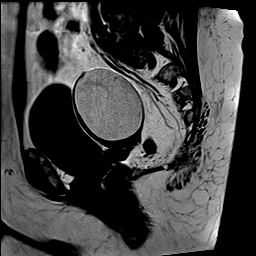
[im 27/38]
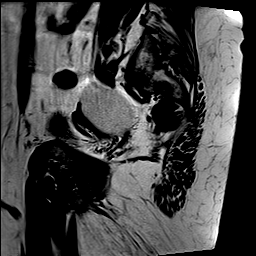
[im 32/38]
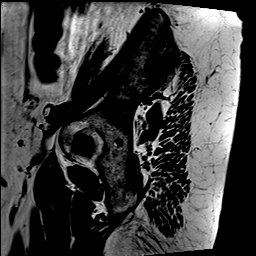
[im 38/38]
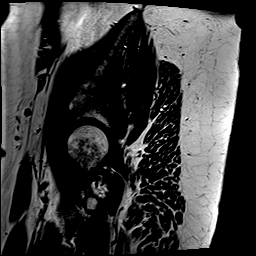

[Series 100: hx · axial · 10.0mm · 0.78mm/px · z∈[-38,+196]mm · 3 of 15 slices shown]
[im 1/15]
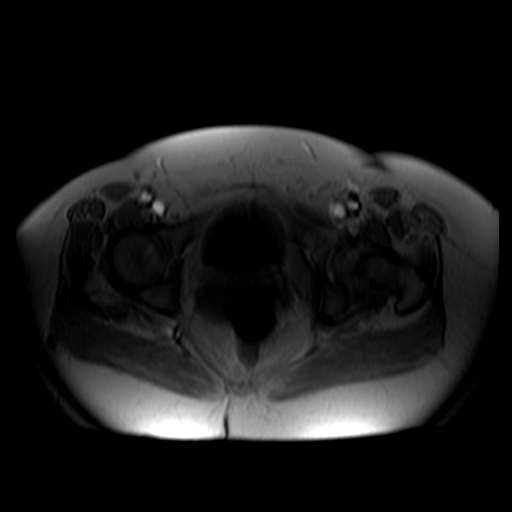
[im 8/15]
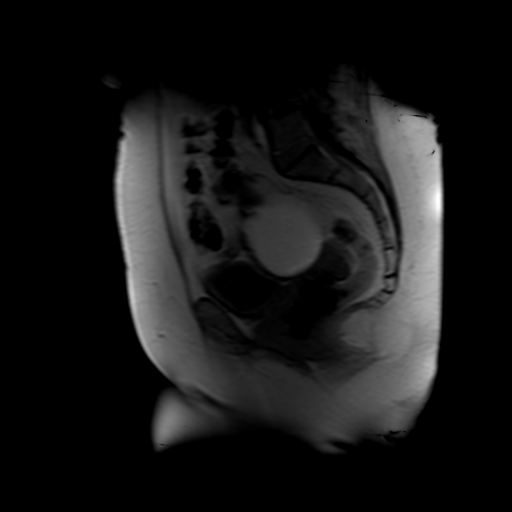
[im 15/15]
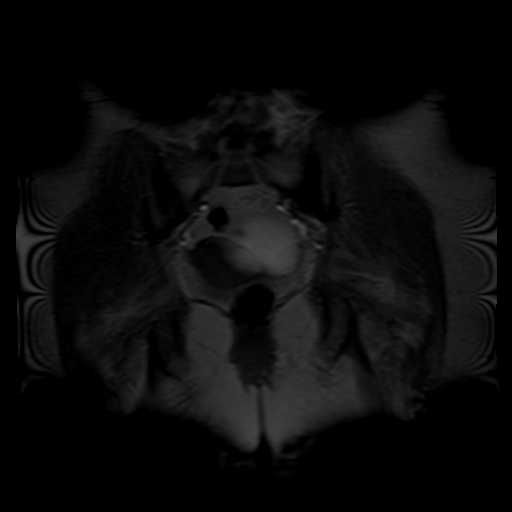

[48 of 48 positions shown; findings below may reference images not displayed]

FINDINGS: Both hips are normally located. Mild degenerative changes
bilaterally. No stress fracture or AVN. No hip joint effusion. No
periarticular fluid collections to suggest a paralabral cyst. Mild
bilateral. Tendinosis without findings for trochanteric bursitis.

Moderate bilateral SI joint degenerative changes with areas of
sclerosis and early spurring. Mild edema like signal abnormality on
both sides of the joint, left greater than right. No definite joint
effusions. Suspect small erosions.

No sacral stress fracture. The lower lumbar vertebral bodies appear
intact. Degenerate disc disease noted at L3-4.

There is a 7.8 x 7.4 x 5.4 cm lesion associated with the left ovary.
This has increased T1 signal intensity, intermediate T2 signal
intensity and dark STIR signal intensity. This is most likely a
large hemorrhagic cyst or endometrioma. Recommend GYN consultation.

Multiple uterine fibroids are noted. The right ovary appears normal.
IMPRESSION: 1. Bilateral SI joint degenerative changes, left greater than right.
No significant joint effusion but suspect small erosions.
2. No stress fracture or AVN involving the hips.
3. 7.8 x 7.4 x 5.4 cm hemorrhagic appearing left ovarian lesion,
likely endometrioma. Recommend GYN consultation.

## 2018-12-15 NOTE — Progress Notes (Signed)
Office Visit Note  Patient: Kara Carey             Date of Birth: 1961-05-22           MRN: [id number]             PCP: Hayden Rasmussen, MD Referring: Hayden Rasmussen, MD Visit Date: 12/16/2018 Occupation: _0 @  Subjective:  Lower back pain.   History of Present Illness: Kara Carey is a 58 y.o. female with history of degenerative disc disease and SI joint pain.  States that after her last visit she was diagnosed with endometrioma which was resected.  She continues to have SI joint pain.  She states the pain gets worse with prolonged sitting and resting it gets better when she is active.  She denies any radiculopathy.  She has had no recurrence of uveitis.  She had MRI of her SI joint which was remarkable for degenerative changes only.  She is also having some discomfort in the sternal region.  The other joints are painful.  Activities of Daily Living:  Patient reports morning stiffness for  20-30 minutes.   Patient Denies nocturnal pain.  Difficulty dressing/grooming: Denies Difficulty climbing stairs: Denies Difficulty getting out of chair: Denies Difficulty using hands for taps, buttons, cutlery, and/or writing: Denies  Review of Systems  Constitutional: Negative for fatigue.  HENT: Positive for mouth dryness. Negative for mouth sores and nose dryness.   Eyes: Negative for pain and itching.  Respiratory: Negative for shortness of breath, wheezing and difficulty breathing.   Cardiovascular: Negative for chest pain, palpitations and swelling in legs/feet.  Gastrointestinal: Negative for abdominal pain, blood in stool and constipation.  Endocrine: Negative for increased urination.  Genitourinary: Negative for painful urination.  Musculoskeletal: Positive for morning stiffness. Negative for arthralgias, joint pain and joint swelling.  Skin: Negative for rash and hair loss.  Allergic/Immunologic: Negative for susceptible to infections.  Neurological:  Negative for dizziness, light-headedness, headaches, memory loss and weakness.  Hematological: Negative for swollen glands.  Psychiatric/Behavioral: Positive for sleep disturbance. Negative for confusion. The patient is not nervous/anxious.     PMFS History:  Patient Active Problem List   Diagnosis Date Noted  . Cyst of left ovary 09/27/2018  . Endometriosis of ovary   . DDD (degenerative disc disease), lumbar 09/08/2018  . Other idiopathic scoliosis, lumbar region 09/08/2018  . History of vitamin D deficiency 08/17/2018  . History of asthma 08/17/2018  . History of anxiety 08/17/2018  . History of uveitis 08/17/2018    Past Medical History:  Diagnosis Date  . Allergy   . Anemia   . Anxiety hx of panic attacks  . Asthma   . Bilateral leg pain   . Family history of adverse reaction to anesthesia    parent's had problem coming out of anesthesia-hard to wake up    Family History  Problem Relation Age of Onset  . Heart disease Mother   . Dementia Mother   . Hyperlipidemia Sister   . Asthma Brother   . Lymphoma Father   . Rheum arthritis Father   . Heart disease Father   . Diabetes Father   . Hypertension Sister   . Liver disease Brother    Past Surgical History:  Procedure Laterality Date  . LESION EXCISION  07/2002   left upper arm-local anesthesia  . ROBOTIC ASSISTED BILATERAL SALPINGO OOPHERECTOMY Left 09/27/2018   Procedure: XI ROBOTIC ASSISTED LEFT SALPINGO OOPHORECTOMY;  Surgeon: Everitt Amber, MD;  Location:  [facility];  Service: Gynecology;  Laterality: Left;   Social History   Social History Narrative  . Not on file    There is no immunization history on file for this patient.   Objective: Vital Signs: BP (!) 146/94 (BP Location: Right Wrist, Patient Position: Sitting, Cuff Size: Normal)   Pulse 94   Resp 14   Ht 5' 3.5" (1.613 m)   Wt 195 lb 9.6 oz (88.7 kg)   BMI 34.11 kg/m    Physical Exam Vitals signs and nursing note reviewed.  Constitutional:       Appearance: She is well-developed.  HENT:     Head: Normocephalic and atraumatic.  Eyes:     Conjunctiva/sclera: Conjunctivae normal.  Neck:     Musculoskeletal: Normal range of motion.  Cardiovascular:     Rate and Rhythm: Normal rate and regular rhythm.     Heart sounds: Normal heart sounds.  Pulmonary:     Effort: Pulmonary effort is normal.     Breath sounds: Normal breath sounds.  Abdominal:     General: Bowel sounds are normal.     Palpations: Abdomen is soft.  Lymphadenopathy:     Cervical: No cervical adenopathy.  Skin:    General: Skin is warm and dry.     Capillary Refill: Capillary refill takes less than 2 seconds.  Neurological:     Mental Status: She is alert and oriented to person, place, and time.  Psychiatric:        Behavior: Behavior normal.      Musculoskeletal Exam: C-spine thoracic spine good range of motion.  She discomfort range of motion of her lumbar spine.  She has some tenderness over SI joints.  Shoulder joints, elbow joints, wrist joints, MCPs PIPs DIPs with good range of motion with no synovitis.  Hip joints knee joints ankles MTPs PIPs been good range of motion with no synovitis.  CDAI Exam: CDAI Score: Not documented Patient Global Assessment: Not documented; Provider Global Assessment: Not documented Swollen: Not documented; Tender: Not documented Joint Exam   Not documented   There is currently no information documented on the homunculus. Go to the Rheumatology activity and complete the homunculus joint exam.  Investigation: No additional findings.  Imaging: No results found.  Recent Labs: Lab Results  Component Value Date   WBC 5.8 09/19/2018   HGB 13.0 09/19/2018   PLT 348 09/19/2018   NA 141 09/19/2018   K 4.4 09/19/2018   CL 105 09/19/2018   CO2 28 09/19/2018   GLUCOSE 90 09/19/2018   BUN 20 09/19/2018   CREATININE 0.81 09/19/2018   BILITOT 0.8 09/19/2018   ALKPHOS 75 09/19/2018   AST 21 09/19/2018   ALT 19  09/19/2018   PROT 7.6 09/19/2018   ALBUMIN 4.1 09/19/2018   CALCIUM 9.3 09/19/2018   GFRAA >60 09/19/2018  August 17, 2018 UA negative, CK 65, TSH normal, SPEP normal, ESR 33, HLA-B27 positive  Speciality Comments: No specialty comments available.  Procedures:  No procedures performed Allergies: Sulfa antibiotics; Clindamycin; Levofloxacin; and Quinolones   Assessment / Plan:     Visit Diagnoses: Chronic SI joint pain - X-rays were unremarkable at initial visit.  MRI of SI joints was consistent with degenerative changes.  P detailed discussion regarding the MRI findings.  Need for regular exercise and stretching was discussed.  I will also refer her to physical therapy.  Weight loss and dietary modifications were discussed.  I believe weight loss will help.  Natural anti-inflammatories  were discussed.  Handout on back exercises was given.  HLA B27 positive  DDD (degenerative disc disease), lumbar - mild.  She continues to have some lower back discomfort.  Other idiopathic scoliosis, lumbar region  History of uveitis - Patient developed uveitis at age 56.  She had no recurrences since then.  BMI 34.0-34.9,adult-I have given her information on weight management clinic.  History of anxiety  History of asthma  History of vitamin D deficiency   Orders: Orders Placed This Encounter  Procedures  . Ambulatory referral to Physical Therapy   No orders of the defined types were placed in this encounter.   Face-to-face time spent with patient was 30 minutes. Greater than 50% of time was spent in counseling and coordination of care.  Follow-Up Instructions: Return in about 1 year (around 12/17/2019) for Osteoarthritis.  Patient will call me if her symptoms get worse.   Bo Merino, MD  Note - This record has been created using Editor, commissioning.  Chart creation errors have been sought, but may not always  have been located. Such creation errors do not reflect on  the standard  of medical care.

## 2018-12-16 ENCOUNTER — Ambulatory Visit (INDEPENDENT_AMBULATORY_CARE_PROVIDER_SITE_OTHER): Payer: BLUE CROSS/BLUE SHIELD | Admitting: Rheumatology

## 2018-12-16 ENCOUNTER — Encounter: Payer: Self-pay | Admitting: Rheumatology

## 2018-12-16 VITALS — BP 146/94 | HR 94 | Resp 14 | Ht 63.5 in | Wt 195.6 lb

## 2018-12-16 DIAGNOSIS — M533 Sacrococcygeal disorders, not elsewhere classified: Secondary | ICD-10-CM

## 2018-12-16 DIAGNOSIS — M5136 Other intervertebral disc degeneration, lumbar region: Secondary | ICD-10-CM | POA: Diagnosis not present

## 2018-12-16 DIAGNOSIS — Z8669 Personal history of other diseases of the nervous system and sense organs: Secondary | ICD-10-CM

## 2018-12-16 DIAGNOSIS — Z8659 Personal history of other mental and behavioral disorders: Secondary | ICD-10-CM

## 2018-12-16 DIAGNOSIS — G8929 Other chronic pain: Secondary | ICD-10-CM

## 2018-12-16 DIAGNOSIS — M51369 Other intervertebral disc degeneration, lumbar region without mention of lumbar back pain or lower extremity pain: Secondary | ICD-10-CM

## 2018-12-16 DIAGNOSIS — Z8639 Personal history of other endocrine, nutritional and metabolic disease: Secondary | ICD-10-CM

## 2018-12-16 DIAGNOSIS — M4126 Other idiopathic scoliosis, lumbar region: Secondary | ICD-10-CM

## 2018-12-16 DIAGNOSIS — Z8709 Personal history of other diseases of the respiratory system: Secondary | ICD-10-CM

## 2018-12-16 DIAGNOSIS — Z1589 Genetic susceptibility to other disease: Secondary | ICD-10-CM

## 2018-12-16 DIAGNOSIS — Z6834 Body mass index (BMI) 34.0-34.9, adult: Secondary | ICD-10-CM

## 2018-12-16 NOTE — Patient Instructions (Signed)

## 2018-12-30 ENCOUNTER — Ambulatory Visit: Payer: BLUE CROSS/BLUE SHIELD | Attending: Rheumatology | Admitting: Physical Therapy

## 2018-12-30 ENCOUNTER — Encounter: Payer: Self-pay | Admitting: Physical Therapy

## 2018-12-30 ENCOUNTER — Other Ambulatory Visit: Payer: Self-pay

## 2018-12-30 DIAGNOSIS — M545 Low back pain, unspecified: Secondary | ICD-10-CM

## 2018-12-30 DIAGNOSIS — G8929 Other chronic pain: Secondary | ICD-10-CM | POA: Insufficient documentation

## 2018-12-30 DIAGNOSIS — M62830 Muscle spasm of back: Secondary | ICD-10-CM | POA: Diagnosis present

## 2018-12-30 NOTE — Therapy (Addendum)
[place], [place], [address] Phone: [phone]   Fax:  [phone]  Physical Therapy Evaluation/discharge   Patient Details  Name: Kara Carey MRN: [id number] Date of Birth: 06-14-1961 Referring Provider (PT): Dr Jaynie Collins    Encounter Date: 12/30/2018  PT End of Session - 12/30/18 0907    Visit Number  1    Number of Visits  12    Date for PT Re-Evaluation  02/10/19    Authorization Type  FOTO back     PT Start Time  0845    PT Stop Time  0930    PT Time Calculation (min)  45 min    Activity Tolerance  Patient tolerated treatment well    Behavior During Therapy  [facility] for tasks assessed/performed       Past Medical History:  Diagnosis Date  . Allergy   . Anemia   . Anxiety hx of panic attacks  . Asthma   . Bilateral leg pain   . Family history of adverse reaction to anesthesia    parent's had problem coming out of anesthesia-hard to wake up    Past Surgical History:  Procedure Laterality Date  . LESION EXCISION  07/2002   left upper arm-local anesthesia  . ROBOTIC ASSISTED BILATERAL SALPINGO OOPHERECTOMY Left 09/27/2018   Procedure: XI ROBOTIC ASSISTED LEFT SALPINGO OOPHORECTOMY;  Surgeon: Everitt Amber, MD;  Location: [facility];  Service: Gynecology;  Laterality: Left;    There were no vitals filed for this visit.   Subjective Assessment - 12/30/18 0852    Subjective  Patient has low back pain that increases at night. It has been going on for several years. She wakes up by 5:00 every morning because of the lower back pain. By the time she walks around the house for a bit the pain goes away. She sits at work.       How long can you sit comfortably?  when she sits for a few hours she feels pain     How long can you stand comfortably?  No limit     How long can you walk comfortably?  walking makes the back feel better    Currently in Pain?  Yes    Pain Score  5     Pain Location   Back    Pain Orientation  Right;Left    Pain Descriptors / Indicators  Aching    Pain Type  Chronic pain    Pain Onset  More than a month ago    Pain Frequency  Constant    Aggravating Factors   sleeping in gerneral     Pain Relieving Factors  moving oin general          [facility] PT Assessment - 12/30/18 0001      Assessment   Medical Diagnosis  Low back pain     Referring Provider (PT)  Dr Jaynie Collins     Onset Date/Surgical Date  --   Several year but has increased recently    Hand Dominance  Right    Next MD Visit  Nothing Scheduled    Prior Therapy  None       Precautions   Precautions  None      Restrictions   Weight Bearing Restrictions  No      Balance Screen   Has the patient fallen in the past 6 months  No    Has the patient had a decrease in  activity level because of a fear of falling?   No    Is the patient reluctant to leave their home because of a fear of falling?   No      Home Environment   Additional Comments  Nothing       Prior Function   Level of Independence  Independent    Vocation  Full time employment    Paediatric nurse       Cognition   Overall Cognitive Status  Within Functional Limits for tasks assessed    Attention  Focused      Observation/Other Assessments   Focus on Therapeutic Outcomes (FOTO)   26% limitation       Sensation   Light Touch  Appears Intact    Additional Comments  denies paratheisas      Coordination   Gross Motor Movements are Fluid and Coordinated  Yes    Fine Motor Movements are Fluid and Coordinated  Yes      Posture/Postural Control   Posture Comments  rounded shoulder; sits slouched       ROM / Strength   AROM / PROM / Strength  AROM;Strength;PROM      AROM   Overall AROM Comments  normal active ROM of her hips; pain lying down flat     AROM Assessment Site  Lumbar    Lumbar Flexion  80    Lumbar Extension  22   tightness at end range    Lumbar - Right Side Bend  pain at end  range     Lumbar - Left Side Bend  pain at end range     Lumbar - Right Rotation  pain at end range     Lumbar - Left Rotation  pain at end range       PROM   Overall PROM Comments  full pasive ROM of the hips       Strength   Overall Strength Comments  5/5 gorss bilateral lower extremity pain. Core wekaness noted     Strength Assessment Site  Hip;Knee    Right/Left Hip  Right;Left      Flexibility   Soft Tissue Assessment /Muscle Length  --   normal hamstring length      Palpation   Spinal mobility  tender to palpation at all lu,mbar segments; reporduction of pain at L3-L4     Palpation comment  spasming into upper gluteals                 Objective measurements completed on examination: See above findings.      [facility] Adult PT Treatment/Exercise - 12/30/18 0001      Exercises   Exercises  Lumbar      Lumbar Exercises: Stretches   Other Lumbar Stretch Exercise  piriformis stretch 3x20 sec hold     Other Lumbar Stretch Exercise  tennis ball trigger point release       Lumbar Exercises: Supine   Bridge Limitations  x20     Other Supine Lumbar Exercises  green clamshell x20                   PT Long Term Goals - 12/30/18 0941      PT LONG TERM GOAL #1   Title  Patient will sleep throughout the night without incxreased back pain.     Time  3    Period  Weeks    Status  New    Target Date  02/10/19      PT LONG TERM GOAL #2   Title  Patient  will demonstrate a 23 % limitation on FOTO    Time  6    Period  Weeks    Status  New    Target Date  02/10/19      PT LONG TERM GOAL #3   Title  Patient will be indepdnent with a core strengthening and stretching program     Time  6    Period  Weeks    Status  New    Target Date  02/10/19      PT LONG TERM GOAL #4   Title  Patient will report no pain while sitting at work     Time  6    Period  Weeks    Status  New    Target Date  02/10/19             Plan - 12/30/18 1057    Clinical  Impression Statement  Patient is a 58 year old female with lower back pain that increase when she sleeps. She has pain with end lrange rotation left and right. She has spasming in bilateral paraspinals and tendenress to palpation into her SI oint. PA gliding of L3-L4 reporduces lumbar symtoms that she feels. She has limitions in hip motion with internal rotation. She would benefit form skilled therapy improve core strength and develop a stretching program to reduce pain.     Personal Factors and Comorbidities  Comorbidity 2    Comorbidities  anxiety, SI joint OA     Examination-Activity Limitations  Sit;Sleep    Stability/Clinical Decision Making  Stable/Uncomplicated    Clinical Decision Making  Low    Rehab Potential  Good    PT Frequency  2x / week    PT Duration  6 weeks    PT Treatment/Interventions  ADLs/Self Care Home Management;Spinal Manipulations;Joint Manipulations;Taping;Cryotherapy;Electrical Stimulation;Iontophoresis 25m/ml Dexamethasone;Moist Heat;Ultrasound;Therapeutic activities;Therapeutic exercise;Neuromuscular re-education;Patient/family education;Manual techniques;Passive range of motion;Dry needling    PT Home Exercise Plan  tennis ball trigger point release; gluteal stretch; bridge, clamshell green     Consulted and Agree with Plan of Care  Patient       Patient will benefit from skilled therapeutic intervention in order to improve the following deficits and impairments:  Pain, Postural dysfunction, Decreased activity tolerance, Decreased strength  Visit Diagnosis: Chronic bilateral low back pain without sciatica  Muscle spasm of back   PHYSICAL THERAPY DISCHARGE SUMMARY  Visits from Start of Care: 1   Current functional level related to goals / functional outcomes: Unknown patient did not return    Remaining deficits: Unknown    Education / Equipment: Unknown   Plan: Patient agrees to discharge.  Patient goals were not met. Patient is being discharged due to  not returning since the last visit.  ?????      Problem List Patient Active Problem List   Diagnosis Date Noted  . Cyst of left ovary 09/27/2018  . Endometriosis of ovary   . DDD (degenerative disc disease), lumbar 09/08/2018  . Other idiopathic scoliosis, lumbar region 09/08/2018  . History of vitamin D deficiency 08/17/2018  . History of asthma 08/17/2018  . History of anxiety 08/17/2018  . History of uveitis 08/17/2018    DCarney LivingPT DPT  12/30/2018, 11:22 AM  [facility] [address]Phone: [phone]  Fax:  [phone] Name: Kara BIRDSALLMRN:  [id number] Date of Birth: [birth year]

## 2019-09-11 ENCOUNTER — Other Ambulatory Visit: Payer: Self-pay | Admitting: Family Medicine

## 2019-09-11 DIAGNOSIS — B349 Viral infection, unspecified: Secondary | ICD-10-CM

## 2019-09-11 DIAGNOSIS — M353 Polymyalgia rheumatica: Secondary | ICD-10-CM

## 2019-09-11 DIAGNOSIS — J45909 Unspecified asthma, uncomplicated: Secondary | ICD-10-CM

## 2019-09-13 ENCOUNTER — Ambulatory Visit
Admission: RE | Admit: 2019-09-13 | Discharge: 2019-09-13 | Disposition: A | Discharge status: Home / Self Care | Payer: BC Managed Care – PPO | Source: Ambulatory Visit | Attending: Family Medicine | Admitting: Family Medicine

## 2019-09-13 DIAGNOSIS — J45909 Unspecified asthma, uncomplicated: Secondary | ICD-10-CM

## 2019-09-13 DIAGNOSIS — M353 Polymyalgia rheumatica: Secondary | ICD-10-CM

## 2019-09-13 DIAGNOSIS — B349 Viral infection, unspecified: Secondary | ICD-10-CM

## 2019-09-22 ENCOUNTER — Encounter: Payer: Self-pay | Admitting: Obstetrics & Gynecology

## 2019-11-27 IMAGING — US US THYROID
1 series · 14 of 25 positions shown · non-contrast
Comparison: None.

CLINICAL DATA: Other. 58-year-old female with polymyalgia, hair
loss and insomnia.

EXAM:
THYROID ULTRASOUND
TECHNIQUE: Ultrasound examination of the thyroid gland and adjacent soft
tissues was performed.

[Series 1: us thyroid · 0.07mm/px · 14 of 47 slices shown]
[im 1/47]
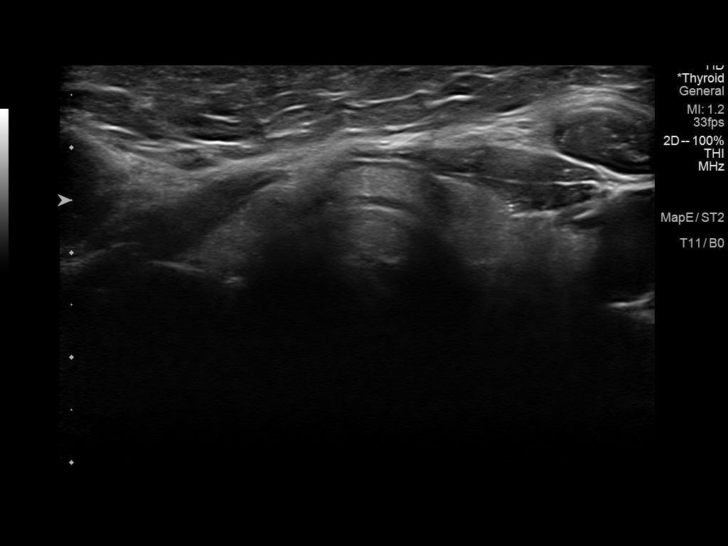
[im 4/47]
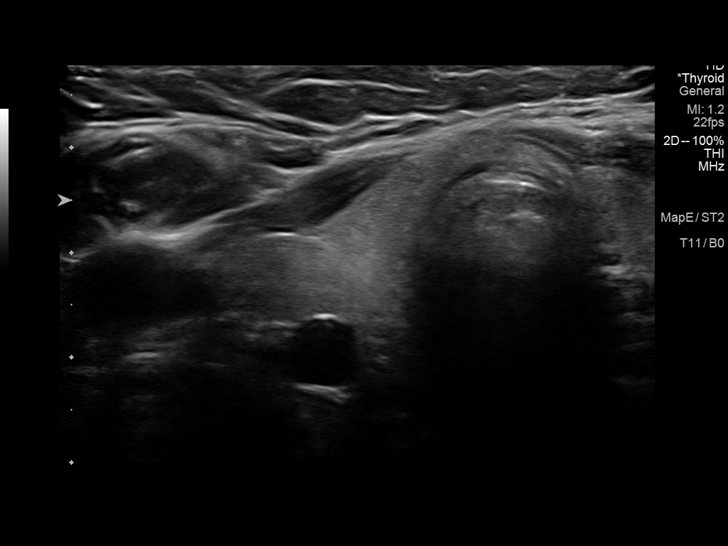
[im 8/47]
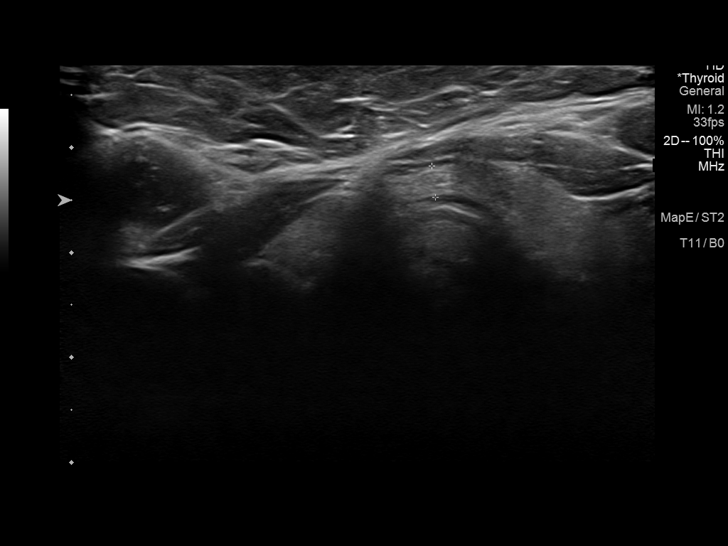
[im 12/47]
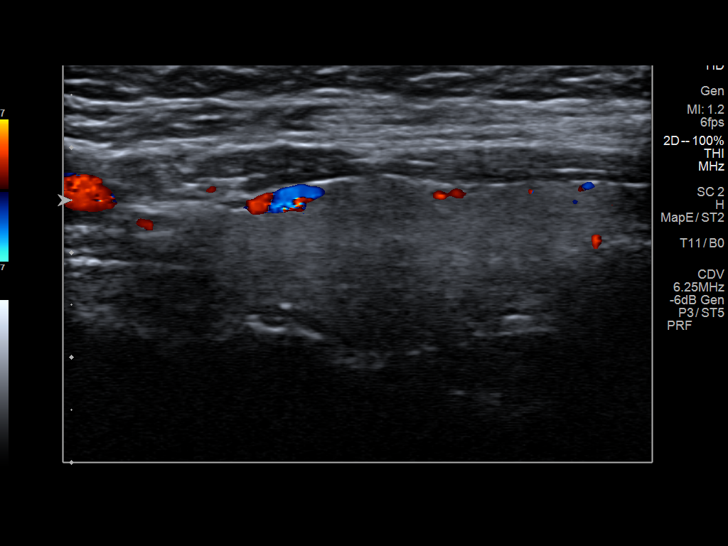
[im 16/47]
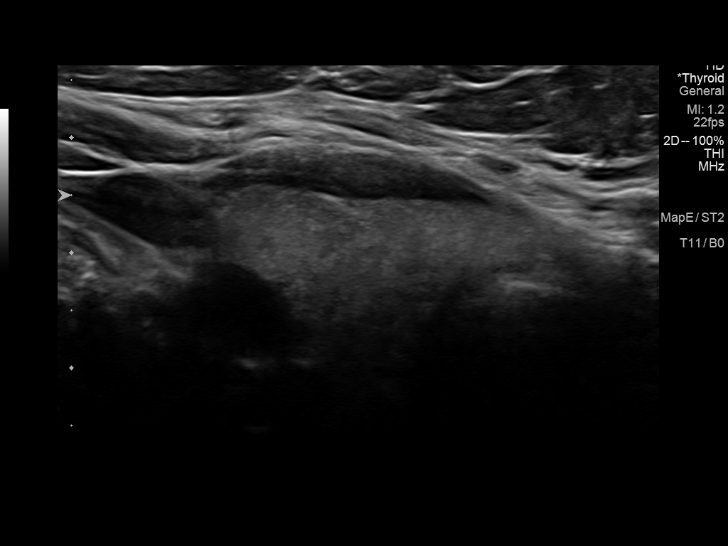
[im 18/47]
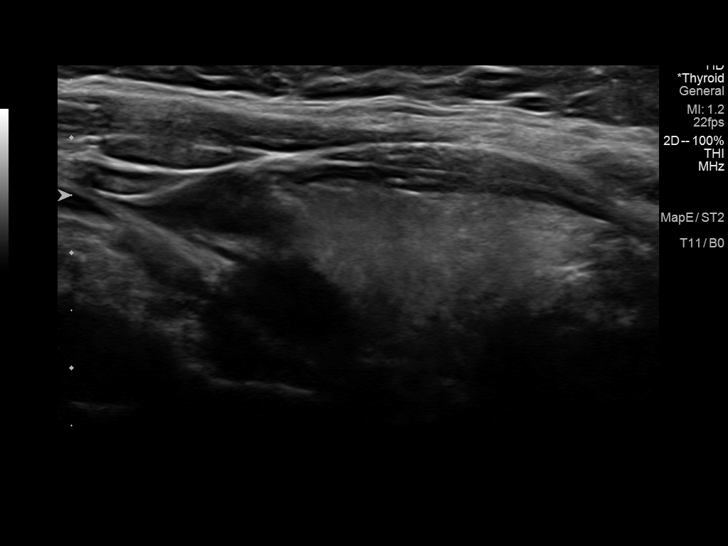
[im 22/47]
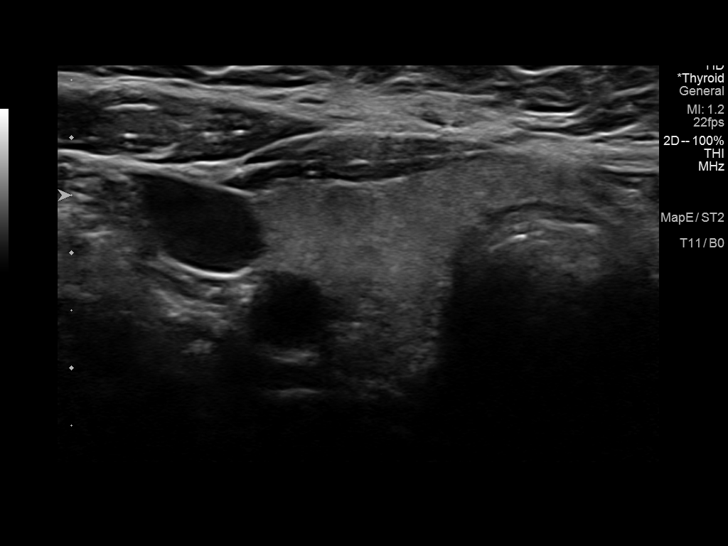
[im 25/47]
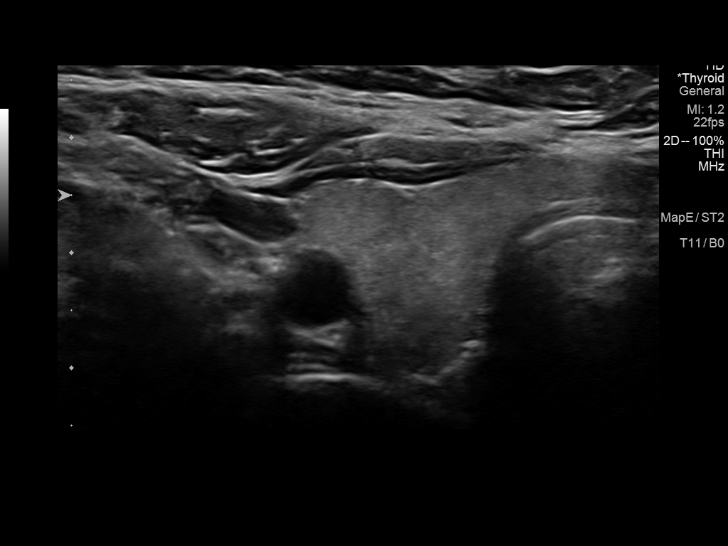
[im 29/47]
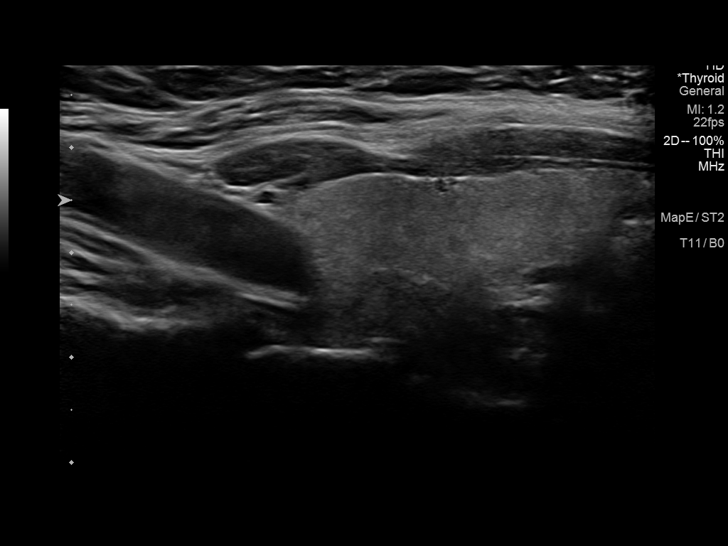
[im 31/47]
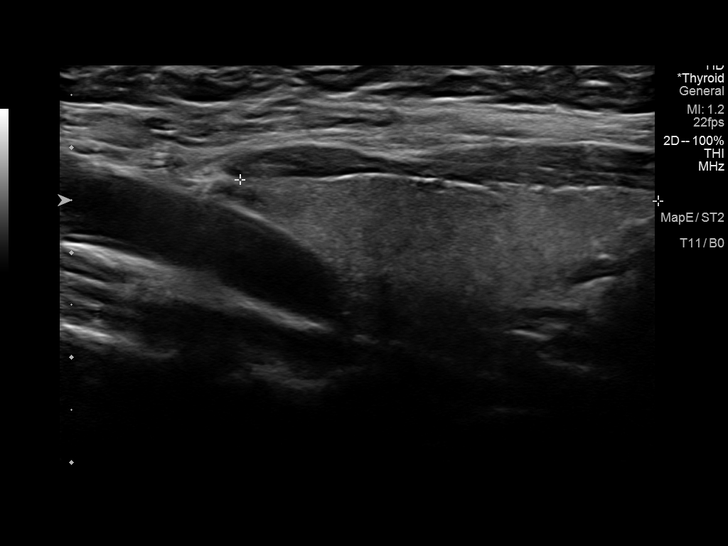
[im 35/47]
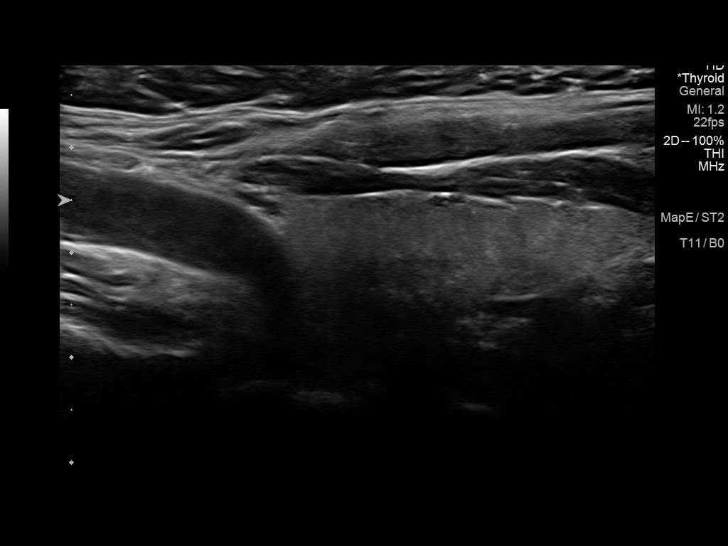
[im 39/47]
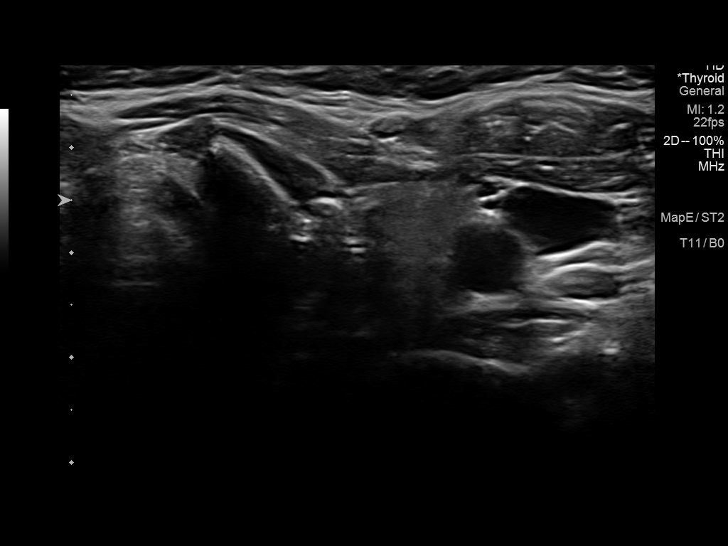
[im 43/47]
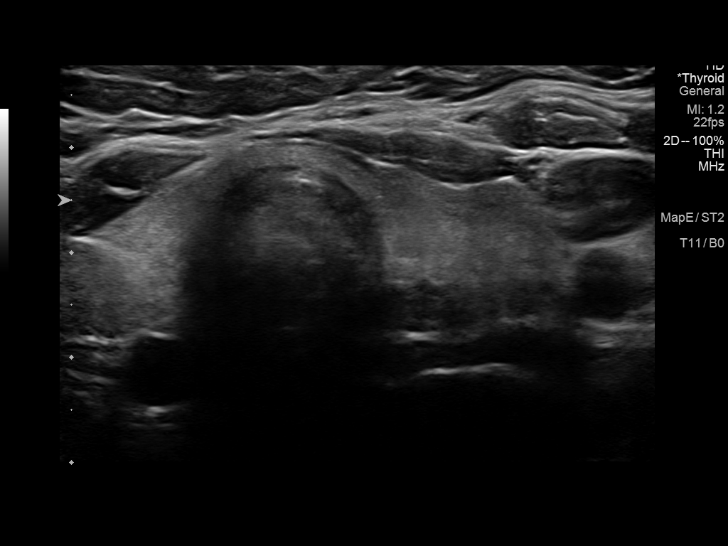
[im 47/47]
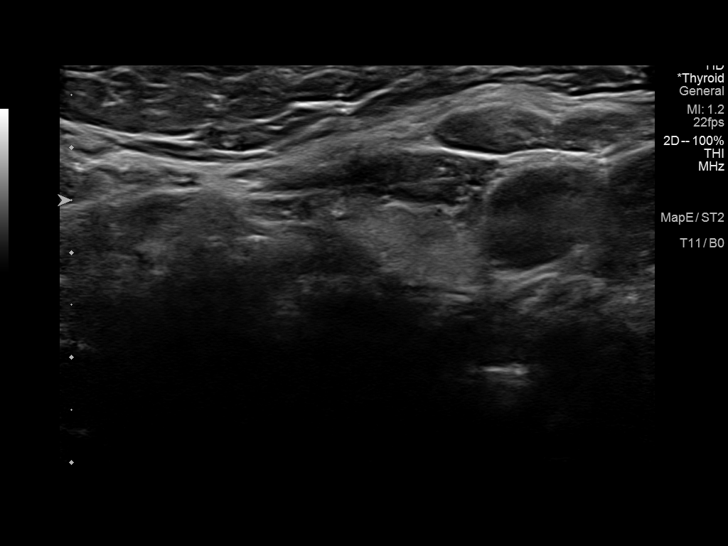

[14 of 25 positions shown; findings below may reference images not displayed]

FINDINGS: Parenchymal Echotexture: Normal

Isthmus: 0.3 cm

Right lobe: 3.8 x 1.7 x 1.8 cm

Left lobe: 4.0 x 1.2 x 1.9 cm

_________________________________________________________

Estimated total number of nodules >/= 1 cm: 0

Number of spongiform nodules >/=  2 cm not described below (TR1): 0

Number of mixed cystic and solid nodules >/= 1.5 cm not described
below (TR2): 0

_________________________________________________________

No discrete nodules are seen within the thyroid gland.
IMPRESSION: Normal sonographic appearance of the thyroid gland.

The above is in keeping with the ACR TI-RADS recommendations - [HOSPITAL] 0196;[DATE].

## 2019-12-06 NOTE — Progress Notes (Addendum)
Office Visit Note  Patient: Kara Carey             Date of Birth: Mar 14, 1961           MRN: [id number]             PCP: Hayden Rasmussen, MD Referring: Hayden Rasmussen, MD Visit Date: 12/15/2019 Occupation: @GUAROCC @  Subjective:  Back pain   History of Present Illness: Kara Carey is a 59 y.o. female with a past medical history of degenerative disc disease and SI joint pain. She states that she has noticed significant improvement in her overall joint pain. She denies SI joint pain and states that her pain in very minimal. She recently got a new mattress and has been working on weight loss which she feels has helped greatly with her pain. She walks daily and does regular stretching and exercises that were recommended by a physical therapist. She has a history of costochondritis for which she still gets occasional pain. She states that she has scarring from the costochondritis below her rib cage that contributes to the pain. She will take Tylenol as needed for pain relief, but states that this is not very often. She denies any other joint pain or joint swelling. Her notes no changes in her uveitis. She endorses eye redness, dry eyes, and dry mouth.  Activities of Daily Living:  Patient reports morning stiffness for 0 none.   Patient Denies nocturnal pain.  Difficulty dressing/grooming: Denies Difficulty climbing stairs: Denies Difficulty getting out of chair: Denies Difficulty using hands for taps, buttons, cutlery, and/or writing: Denies  Review of Systems  Constitutional: Negative for fatigue, night sweats, weight gain and weight loss.  HENT: Positive for mouth dryness. Negative for mouth sores, trouble swallowing, trouble swallowing and nose dryness.   Eyes: Positive for dryness. Negative for pain, redness and visual disturbance.  Respiratory: Negative for cough, shortness of breath and difficulty breathing.   Cardiovascular: Negative for chest pain, palpitations,  hypertension, irregular heartbeat and swelling in legs/feet.  Gastrointestinal: Negative for blood in stool, constipation and diarrhea.  Endocrine: Negative for excessive thirst and increased urination.  Genitourinary: Negative for difficulty urinating and vaginal dryness.  Musculoskeletal: Negative for arthralgias, joint pain, joint swelling, myalgias, muscle weakness, morning stiffness, muscle tenderness and myalgias.  Skin: Negative for color change, rash, hair loss, skin tightness, ulcers and sensitivity to sunlight.  Allergic/Immunologic: Negative for susceptible to infections.  Neurological: Negative for dizziness, numbness, memory loss, night sweats and weakness.  Hematological: Negative for bruising/bleeding tendency and swollen glands.  Psychiatric/Behavioral: Negative for depressed mood and sleep disturbance. The patient is not nervous/anxious.     PMFS History:  Patient Active Problem List   Diagnosis Date Noted  . Cyst of left ovary 09/27/2018  . Endometriosis of ovary   . DDD (degenerative disc disease), lumbar 09/08/2018  . Other idiopathic scoliosis, lumbar region 09/08/2018  . History of vitamin D deficiency 08/17/2018  . History of asthma 08/17/2018  . History of anxiety 08/17/2018  . History of uveitis 08/17/2018    Past Medical History:  Diagnosis Date  . Allergy   . Anemia   . Anxiety hx of panic attacks  . Asthma   . Bilateral leg pain   . Family history of adverse reaction to anesthesia    parent's had problem coming out of anesthesia-hard to wake up    Family History  Problem Relation Age of Onset  . Heart disease Mother   . Dementia  Mother   . Hyperlipidemia Sister   . Asthma Brother   . Lymphoma Father   . Rheum arthritis Father   . Heart disease Father   . Diabetes Father   . Hypertension Sister   . Liver disease Brother    Past Surgical History:  Procedure Laterality Date  . LESION EXCISION  07/2002   left upper arm-local anesthesia  .  ROBOTIC ASSISTED BILATERAL SALPINGO OOPHERECTOMY Left 09/27/2018   Procedure: XI ROBOTIC ASSISTED LEFT SALPINGO OOPHORECTOMY;  Surgeon: Everitt Amber, MD;  Location: [facility];  Service: Gynecology;  Laterality: Left;   Social History   Social History Narrative  . Not on file    There is no immunization history on file for this patient.   Objective: Vital Signs: BP 135/78 (BP Location: Left Arm, Patient Position: Sitting, Cuff Size: Normal)   Pulse 74   Resp 16   Ht 5' 3"  (1.6 m)   Wt 187 lb 3.7 oz (84.9 kg)   BMI 33.17 kg/m    Physical Exam Vitals and nursing note reviewed.  Constitutional:      Appearance: She is well-developed.  HENT:     Head: Normocephalic and atraumatic.  Eyes:     Conjunctiva/sclera: Conjunctivae normal.  Cardiovascular:     Rate and Rhythm: Normal rate and regular rhythm.     Heart sounds: Normal heart sounds.  Pulmonary:     Effort: Pulmonary effort is normal.     Breath sounds: Normal breath sounds.  Abdominal:     General: Bowel sounds are normal.     Palpations: Abdomen is soft.  Musculoskeletal:     Cervical back: Normal range of motion.  Lymphadenopathy:     Cervical: No cervical adenopathy.  Skin:    General: Skin is warm and dry.     Capillary Refill: Capillary refill takes less than 2 seconds.  Neurological:     Mental Status: She is alert and oriented to person, place, and time.  Psychiatric:        Behavior: Behavior normal.      Musculoskeletal Exam: Cervical spine, thoracic spine, and lumbar spine with good ROM. No midline spinal tenderness or SI joint tenderness. Shoulder joints, elbow joints, wrist joints, MCPs, PIPs, and DIPs with good ROM and no synovitis. No warmth or effusion of bilateral knees. Hip joints, knee joints, ankle joints, MTPs, PIPs, and DIPs with good ROM and no synovitis. Some tenderness to palpation of the medial aspect of the left ankle.  CDAI Exam: CDAI Score: - Patient Global: -; Provider Global: -  Swollen: -; Tender: - Joint Exam 12/15/2019   No joint exam has been documented for this visit   There is currently no information documented on the homunculus. Go to the Rheumatology activity and complete the homunculus joint exam.  Investigation: No additional findings.  Imaging: No results found.  Recent Labs: Lab Results  Component Value Date   WBC 5.8 09/19/2018   HGB 13.0 09/19/2018   PLT 348 09/19/2018   NA 141 09/19/2018   K 4.4 09/19/2018   CL 105 09/19/2018   CO2 28 09/19/2018   GLUCOSE 90 09/19/2018   BUN 20 09/19/2018   CREATININE 0.81 09/19/2018   BILITOT 0.8 09/19/2018   ALKPHOS 75 09/19/2018   AST 21 09/19/2018   ALT 19 09/19/2018   PROT 7.6 09/19/2018   ALBUMIN 4.1 09/19/2018   CALCIUM 9.3 09/19/2018   GFRAA >60 09/19/2018    Speciality Comments: No specialty comments available.  Procedures:  No procedures performed Allergies: Sulfa antibiotics, Clindamycin, Levofloxacin, and Quinolones   Assessment / Plan:     Visit Diagnoses: Chronic SI joint pain - X-rays were unremarkable at initial visit.  MRI of SI joints was consistent with degenerative changes. Patient endorses minimal pain today in her joints and denies SI joint pain. She walks daily and performs regular exercises recommended by a physical therapist. She has been working on weight loss. Encouraged patient to continue current activities and weight loss regimen.   HLA B27 positive  DDD (degenerative disc disease), lumbar - mild.  She has intermittent pain.  She denies lumbar back pain today. No SI joint tenderness or midline spinal tenderness.  Core strength exercises were discussed.  Other idiopathic scoliosis, lumbar region  History of uveitis - Patient developed uveitis at age 20. She has not experienced any changes in her uveitis. She continues to have eye redness and eye dryness.   BMI 34.0-34.9,adult - BMI today is 33.17, patient is currently exercising and working on weight loss.  Encouraged continued exercise and healthy diet.   History of asthma  History of anxiety  History of vitamin D deficiency  Orders: No orders of the defined types were placed in this encounter.  No orders of the defined types were placed in this encounter.     Follow-Up Instructions: Return in about 6 months (around 06/13/2020) for Osteoarthritis.   Bo Merino, MD  Note - This record has been created using Editor, commissioning.  Chart creation errors have been sought, but may not always  have been located. Such creation errors do not reflect on  the standard of medical care.

## 2019-12-15 ENCOUNTER — Encounter: Payer: Self-pay | Admitting: Physician Assistant

## 2019-12-15 ENCOUNTER — Ambulatory Visit (INDEPENDENT_AMBULATORY_CARE_PROVIDER_SITE_OTHER): Payer: BC Managed Care – PPO | Admitting: Rheumatology

## 2019-12-15 ENCOUNTER — Other Ambulatory Visit: Payer: Self-pay

## 2019-12-15 VITALS — BP 135/78 | HR 74 | Resp 16 | Ht 63.0 in | Wt 187.2 lb

## 2019-12-15 DIAGNOSIS — M4126 Other idiopathic scoliosis, lumbar region: Secondary | ICD-10-CM

## 2019-12-15 DIAGNOSIS — Z1589 Genetic susceptibility to other disease: Secondary | ICD-10-CM

## 2019-12-15 DIAGNOSIS — M5136 Other intervertebral disc degeneration, lumbar region: Secondary | ICD-10-CM | POA: Diagnosis not present

## 2019-12-15 DIAGNOSIS — M533 Sacrococcygeal disorders, not elsewhere classified: Secondary | ICD-10-CM | POA: Diagnosis not present

## 2019-12-15 DIAGNOSIS — Z8639 Personal history of other endocrine, nutritional and metabolic disease: Secondary | ICD-10-CM

## 2019-12-15 DIAGNOSIS — Z8659 Personal history of other mental and behavioral disorders: Secondary | ICD-10-CM

## 2019-12-15 DIAGNOSIS — G8929 Other chronic pain: Secondary | ICD-10-CM

## 2019-12-15 DIAGNOSIS — Z8709 Personal history of other diseases of the respiratory system: Secondary | ICD-10-CM

## 2019-12-15 DIAGNOSIS — Z6834 Body mass index (BMI) 34.0-34.9, adult: Secondary | ICD-10-CM

## 2019-12-15 DIAGNOSIS — Z8669 Personal history of other diseases of the nervous system and sense organs: Secondary | ICD-10-CM

## 2020-06-03 NOTE — Progress Notes (Signed)
Office Visit Note  Patient: Kara Carey             Date of Birth: 02-15-1961           MRN: [id number]             PCP: Hayden Rasmussen, MD Referring: Hayden Rasmussen, MD Visit Date: 06/14/2020 Occupation: @GUAROCC @  Subjective:  Low back pain   History of Present Illness: Kara Carey is a 59 y.o. female with history of chronic SI joint pain, HLA-B27 positive, and DDD.  Patient reports that her lower back and SI joint pain has improved since she has increased her activity level.  She has a treadmill at home and has been walking on a regular basis.  She states that her discomfort and stiffness improves with activity.  She denies any symptoms of radiculopathy.  She states that she tries to change positions frequently while at work.  She plans on getting a stand-up desk.  She reports that she has occasional discomfort in the left knee joint but denies any joint swelling or warmth.  She states that her discomfort is exacerbated by going down steps.  She denies any other joint pain or joint swelling at this time.  She denies any Achilles tendinitis or plantar fasciitis.  She denies any uveitis flares.  She denies any new concerns at this time. She has received both COVID-19 vaccinations.  Activities of Daily Living:  Patient reports morning stiffness for a few  minutes.   Patient Reports nocturnal pain.  Difficulty dressing/grooming: Denies Difficulty climbing stairs: Denies Difficulty getting out of chair: Denies Difficulty using hands for taps, buttons, cutlery, and/or writing: Denies  Review of Systems  Constitutional: Negative for fatigue.  HENT: Positive for mouth dryness. Negative for mouth sores and nose dryness.   Eyes: Positive for dryness.  Respiratory: Negative for shortness of breath and difficulty breathing.   Cardiovascular: Negative for chest pain and palpitations.  Gastrointestinal: Negative for blood in stool, constipation and diarrhea.  Endocrine:  Negative for increased urination.  Genitourinary: Negative for difficulty urinating.  Musculoskeletal: Positive for morning stiffness. Negative for arthralgias, joint pain, joint swelling, myalgias, muscle tenderness and myalgias.  Skin: Negative for color change, rash and redness.  Allergic/Immunologic: Negative for susceptible to infections.  Neurological: Negative for dizziness, numbness, headaches, memory loss and weakness.  Hematological: Negative for bruising/bleeding tendency.  Psychiatric/Behavioral: Negative for confusion.    PMFS History:  Patient Active Problem List   Diagnosis Date Noted  . Cyst of left ovary 09/27/2018  . Endometriosis of ovary   . DDD (degenerative disc disease), lumbar 09/08/2018  . Other idiopathic scoliosis, lumbar region 09/08/2018  . History of vitamin D deficiency 08/17/2018  . History of asthma 08/17/2018  . History of anxiety 08/17/2018  . History of uveitis 08/17/2018    Past Medical History:  Diagnosis Date  . Allergy   . Anemia   . Anxiety hx of panic attacks  . Asthma   . Bilateral leg pain   . Family history of adverse reaction to anesthesia    parent's had problem coming out of anesthesia-hard to wake up    Family History  Problem Relation Age of Onset  . Heart disease Mother   . Dementia Mother   . Hyperlipidemia Sister   . Asthma Brother   . Lymphoma Father   . Rheum arthritis Father   . Heart disease Father   . Diabetes Father   . Hypertension Sister   .  Liver disease Brother    Past Surgical History:  Procedure Laterality Date  . LESION EXCISION  07/2002   left upper arm-local anesthesia  . ROBOTIC ASSISTED BILATERAL SALPINGO OOPHERECTOMY Left 09/27/2018   Procedure: XI ROBOTIC ASSISTED LEFT SALPINGO OOPHORECTOMY;  Surgeon: Everitt Amber, MD;  Location: [facility];  Service: Gynecology;  Laterality: Left;   Social History   Social History Narrative  . Not on file   Immunization History  Administered Date(s)  Administered  . Influenza-Unspecified 09/11/2019  . PFIZER SARS-COV-2 Vaccination 01/05/2020, 01/26/2020     Objective: Vital Signs: BP (!) 146/78 (BP Location: Right Arm, Patient Position: Sitting, Cuff Size: Normal)   Pulse 70   Resp 14   Ht 5' 3.5" (1.613 m)   Wt 193 lb (87.5 kg)   BMI 33.65 kg/m    Physical Exam Vitals and nursing note reviewed.  Constitutional:      Appearance: She is well-developed.  HENT:     Head: Normocephalic and atraumatic.  Eyes:     Conjunctiva/sclera: Conjunctivae normal.  Pulmonary:     Effort: Pulmonary effort is normal.  Abdominal:     Palpations: Abdomen is soft.  Musculoskeletal:     Cervical back: Normal range of motion.  Skin:    General: Skin is warm and dry.     Capillary Refill: Capillary refill takes less than 2 seconds.  Neurological:     Mental Status: She is alert and oriented to person, place, and time.  Psychiatric:        Behavior: Behavior normal.      Musculoskeletal Exam: C-spine, thoracic spine, lumbar spine have good range of motion.  Midline spinal tenderness in the lumbar region.  No tenderness to palpation of SI joints.  Shoulder joints, elbow joints, wrist joints, MCPs, PIPs, DIPs have good range of motion with no synovitis.  She has complete fist formation bilaterally.  Hip joints have good range of motion with no discomfort.  Knee joints have good range of motion with no warmth or effusion.  No Baker's cyst palpable.  No knee crepitus noted.  Ankle joints have good range of motion with no tenderness or inflammation.  No Achilles tendinitis or plantar fasciitis.  CDAI Exam: CDAI Score: -- Patient Global: --; Provider Global: -- Swollen: --; Tender: -- Joint Exam 06/14/2020   No joint exam has been documented for this visit   There is currently no information documented on the homunculus. Go to the Rheumatology activity and complete the homunculus joint exam.  Investigation: No additional  findings.  Imaging: No results found.  Recent Labs: Lab Results  Component Value Date   WBC 5.8 09/19/2018   HGB 13.0 09/19/2018   PLT 348 09/19/2018   NA 141 09/19/2018   K 4.4 09/19/2018   CL 105 09/19/2018   CO2 28 09/19/2018   GLUCOSE 90 09/19/2018   BUN 20 09/19/2018   CREATININE 0.81 09/19/2018   BILITOT 0.8 09/19/2018   ALKPHOS 75 09/19/2018   AST 21 09/19/2018   ALT 19 09/19/2018   PROT 7.6 09/19/2018   ALBUMIN 4.1 09/19/2018   CALCIUM 9.3 09/19/2018   GFRAA >60 09/19/2018    Speciality Comments: No specialty comments available.  Procedures:  No procedures performed Allergies: Sulfa antibiotics, Clindamycin, Levofloxacin, and Quinolones   Assessment / Plan:     Visit Diagnoses: Chronic SI joint pain - X-rays were unremarkable at initial visit.  MRI of SI joints was consistent with degenerative changes.  She has no tenderness to  palpation over bilateral SI joints.  Her lower back discomfort and stiffness has improved as she has increased her level of activity.  She tries to change positions frequently while at work.  She was given a prescription for a vari standing desk.  We discussed the importance of regular exercise.  She was given a handout of exercises to perform.  She was advised to notify us if she develops any new or worsening symptoms.  She will follow-up in the office in 6 months.  HLA B27 positive  DDD (degenerative disc disease), lumbar - mild.  She has midline spinal tenderness in the lumbar region.  She is not experiencing any symptoms of radiculopathy.  Her discomfort improves with physical activity.  She was advised to notify us if her lower back pain persists or worsens.  Other idiopathic scoliosis, lumbar region: She has had less discomfort in her lower back since increasing her level of activity.  History of uveitis - Patient developed uveitis at age 59.  She has not had any recent signs or symptoms of uveitis flare.  She is not experiencing any  eye pain, photophobia, or conjunctival injection.  She states that she does have a history of rosacea in her eyes and is followed closely by her ophthalmologist.  She states that she started wearing eye mask at night to help with the eye dryness.  She uses eyedrops on a daily basis.  BMI 34.0-34.9,adult: BMI 33.65.  Patient has been trying to increase her exercise regimen and plans on trying to lose weight.  History of vitamin D deficiency: She is taking vitamin D 1000 units daily.  Chronic pain of left knee: She experiences intermittent discomfort in the left knee especially when going down steps.  She has not noticed any joint swelling or warmth.  She has good range of motion on exam with no warmth or effusion.  No Baker's cyst was palpable.  No knee crepitus noted. She is not experiencing any mechanical symptoms.  She was encouraged to ice, elevate, and use compression if her knee joint pain persists or worsens.  She does not like the smell of Voltaren gel and does not want to take any over-the-counter products for pain relief since her knee joint pain has not been severe.  We discussed the importance of regular exercise and lower extremity muscle strengthening.  She also plans on trying to work on weight loss.  She was given a handout of knee joint exercises to perform.  If her left knee joint pain persists or worsens she will return for an x-ray of the left knee for further evaluation  Other medical conditions are listed as follows:  History of asthma  History of anxiety  Orders: No orders of the defined types were placed in this encounter.  No orders of the defined types were placed in this encounter.     Follow-Up Instructions: Return in 6 months (on 12/15/2020) for DDD.   Ofilia Neas, PA-C  Note - This record has been created using Dragon software.  Chart creation errors have been sought, but may not always  have been located. Such creation errors do not reflect on  the  standard of medical care.

## 2020-06-14 ENCOUNTER — Encounter: Payer: Self-pay | Admitting: Physician Assistant

## 2020-06-14 ENCOUNTER — Ambulatory Visit (INDEPENDENT_AMBULATORY_CARE_PROVIDER_SITE_OTHER): Payer: BC Managed Care – PPO | Admitting: Physician Assistant

## 2020-06-14 ENCOUNTER — Other Ambulatory Visit: Payer: Self-pay

## 2020-06-14 VITALS — BP 146/78 | HR 70 | Resp 14 | Ht 63.5 in | Wt 193.0 lb

## 2020-06-14 DIAGNOSIS — Z1589 Genetic susceptibility to other disease: Secondary | ICD-10-CM

## 2020-06-14 DIAGNOSIS — G8929 Other chronic pain: Secondary | ICD-10-CM

## 2020-06-14 DIAGNOSIS — M25562 Pain in left knee: Secondary | ICD-10-CM

## 2020-06-14 DIAGNOSIS — M533 Sacrococcygeal disorders, not elsewhere classified: Secondary | ICD-10-CM

## 2020-06-14 DIAGNOSIS — Z8639 Personal history of other endocrine, nutritional and metabolic disease: Secondary | ICD-10-CM

## 2020-06-14 DIAGNOSIS — Z6834 Body mass index (BMI) 34.0-34.9, adult: Secondary | ICD-10-CM

## 2020-06-14 DIAGNOSIS — Z8709 Personal history of other diseases of the respiratory system: Secondary | ICD-10-CM

## 2020-06-14 DIAGNOSIS — M4126 Other idiopathic scoliosis, lumbar region: Secondary | ICD-10-CM | POA: Diagnosis not present

## 2020-06-14 DIAGNOSIS — M5136 Other intervertebral disc degeneration, lumbar region: Secondary | ICD-10-CM | POA: Diagnosis not present

## 2020-06-14 DIAGNOSIS — Z8659 Personal history of other mental and behavioral disorders: Secondary | ICD-10-CM

## 2020-06-14 DIAGNOSIS — Z8669 Personal history of other diseases of the nervous system and sense organs: Secondary | ICD-10-CM

## 2020-06-14 NOTE — Patient Instructions (Signed)
Journal for Nurse Practitioners, 15(4), 263-267. Retrieved July 25, 2018 from [email or web address]">  Knee Exercises Ask your health care provider which exercises are safe for you. Do exercises exactly as told by your health care provider and adjust them as directed. It is normal to feel mild stretching, pulling, tightness, or discomfort as you do these exercises. Stop right away if you feel sudden pain or your pain gets worse. Do not begin these exercises until told by your health care provider. Stretching and range-of-motion exercises These exercises warm up your muscles and joints and improve the movement and flexibility of your knee. These exercises also help to relieve pain and swelling. Knee extension, prone 1. Lie on your abdomen (prone position) on a bed. 2. Place your left / right knee just beyond the edge of the surface so your knee is not on the bed. You can put a towel under your left / right thigh just above your kneecap for comfort. 3. Relax your leg muscles and allow gravity to straighten your knee (extension). You should feel a stretch behind your left / right knee. 4. Hold this position for __________ seconds. 5. Scoot up so your knee is supported between repetitions. Repeat __________ times. Complete this exercise __________ times a day. Knee flexion, active  1. Lie on your back with both legs straight. If this causes back discomfort, bend your left / right knee so your foot is flat on the floor. 2. Slowly slide your left / right heel back toward your buttocks. Stop when you feel a gentle stretch in the front of your knee or thigh (flexion). 3. Hold this position for __________ seconds. 4. Slowly slide your left / right heel back to the starting position. Repeat __________ times. Complete this exercise __________ times a day. Quadriceps stretch, prone  1. Lie on your abdomen on a firm surface, such as a bed or padded floor. 2. Bend your left / right knee and hold  your ankle. If you cannot reach your ankle or pant leg, loop a belt around your foot and grab the belt instead. 3. Gently pull your heel toward your buttocks. Your knee should not slide out to the side. You should feel a stretch in the front of your thigh and knee (quadriceps). 4. Hold this position for __________ seconds. Repeat __________ times. Complete this exercise __________ times a day. Hamstring, supine 1. Lie on your back (supine position). 2. Loop a belt or towel over the ball of your left / right foot. The ball of your foot is on the walking surface, right under your toes. 3. Straighten your left / right knee and slowly pull on the belt to raise your leg until you feel a gentle stretch behind your knee (hamstring). ? Do not let your knee bend while you do this. ? Keep your other leg flat on the floor. 4. Hold this position for __________ seconds. Repeat __________ times. Complete this exercise __________ times a day. Strengthening exercises These exercises build strength and endurance in your knee. Endurance is the ability to use your muscles for a long time, even after they get tired. Quadriceps, isometric This exercise stretches the muscles in front of your thigh (quadriceps) without moving your knee joint (isometric). 1. Lie on your back with your left / right leg extended and your other knee bent. Put a rolled towel or small pillow under your knee if told by your health care provider. 2. Slowly tense the muscles in the front of your left /   right thigh. You should see your kneecap slide up toward your hip or see increased dimpling just above the knee. This motion will push the back of the knee toward the floor. 3. For __________ seconds, hold the muscle as tight as you can without increasing your pain. 4. Relax the muscles slowly and completely. Repeat __________ times. Complete this exercise __________ times a day. Straight leg raises This exercise stretches the muscles in front  of your thigh (quadriceps) and the muscles that move your hips (hip flexors). 1. Lie on your back with your left / right leg extended and your other knee bent. 2. Tense the muscles in the front of your left / right thigh. You should see your kneecap slide up or see increased dimpling just above the knee. Your thigh may even shake a bit. 3. Keep these muscles tight as you raise your leg 4-6 inches (10-15 cm) off the floor. Do not let your knee bend. 4. Hold this position for __________ seconds. 5. Keep these muscles tense as you lower your leg. 6. Relax your muscles slowly and completely after each repetition. Repeat __________ times. Complete this exercise __________ times a day. Hamstring, isometric 1. Lie on your back on a firm surface. 2. Bend your left / right knee about __________ degrees. 3. Dig your left / right heel into the surface as if you are trying to pull it toward your buttocks. Tighten the muscles in the back of your thighs (hamstring) to "dig" as hard as you can without increasing any pain. 4. Hold this position for __________ seconds. 5. Release the tension gradually and allow your muscles to relax completely for __________ seconds after each repetition. Repeat __________ times. Complete this exercise __________ times a day. Hamstring curls If told by your health care provider, do this exercise while wearing ankle weights. Begin with __________ lb weights. Then increase the weight by 1 lb (0.5 kg) increments. Do not wear ankle weights that are more than __________ lb. 1. Lie on your abdomen with your legs straight. 2. Bend your left / right knee as far as you can without feeling pain. Keep your hips flat against the floor. 3. Hold this position for __________ seconds. 4. Slowly lower your leg to the starting position. Repeat __________ times. Complete this exercise __________ times a day. Squats This exercise strengthens the muscles in front of your thigh and knee  (quadriceps). 1. Stand in front of a table, with your feet and knees pointing straight ahead. You may rest your hands on the table for balance but not for support. 2. Slowly bend your knees and lower your hips like you are going to sit in a chair. ? Keep your weight over your heels, not over your toes. ? Keep your lower legs upright so they are parallel with the table legs. ? Do not let your hips go lower than your knees. ? Do not bend lower than told by your health care provider. ? If your knee pain increases, do not bend as low. 3. Hold the squat position for __________ seconds. 4. Slowly push with your legs to return to standing. Do not use your hands to pull yourself to standing. Repeat __________ times. Complete this exercise __________ times a day. Wall slides This exercise strengthens the muscles in front of your thigh and knee (quadriceps). 1. Lean your back against a smooth wall or door, and walk your feet out 18-24 inches (46-61 cm) from it. 2. Place your feet hip-width apart. 3.   Slowly slide down the wall or door until your knees bend __________ degrees. Keep your knees over your heels, not over your toes. Keep your knees in line with your hips. 4. Hold this position for __________ seconds. Repeat __________ times. Complete this exercise __________ times a day. Straight leg raises This exercise strengthens the muscles that rotate the leg at the hip and move it away from your body (hip abductors). 1. Lie on your side with your left / right leg in the top position. Lie so your head, shoulder, knee, and hip line up. You may bend your bottom knee to help you keep your balance. 2. Roll your hips slightly forward so your hips are stacked directly over each other and your left / right knee is facing forward. 3. Leading with your heel, lift your top leg 4-6 inches (10-15 cm). You should feel the muscles in your outer hip lifting. ? Do not let your foot drift forward. ? Do not let your knee  roll toward the ceiling. 4. Hold this position for __________ seconds. 5. Slowly return your leg to the starting position. 6. Let your muscles relax completely after each repetition. Repeat __________ times. Complete this exercise __________ times a day. Straight leg raises This exercise stretches the muscles that move your hips away from the front of the pelvis (hip extensors). 1. Lie on your abdomen on a firm surface. You can put a pillow under your hips if that is more comfortable. 2. Tense the muscles in your buttocks and lift your left / right leg about 4-6 inches (10-15 cm). Keep your knee straight as you lift your leg. 3. Hold this position for __________ seconds. 4. Slowly lower your leg to the starting position. 5. Let your leg relax completely after each repetition. Repeat __________ times. Complete this exercise __________ times a day. This information is not intended to replace advice given to you by your health care provider. Make sure you discuss any questions you have with your health care provider. Document Revised: 07/26/2018 Document Reviewed: 07/26/2018 [employer] Patient Education  2020 [employer]. Sacroiliac Joint Dysfunction  Sacroiliac joint dysfunction is a condition that causes inflammation on one or both sides of the sacroiliac (SI) joint. The SI joint connects the lower part of the spine (sacrum) with the two upper portions of the pelvis (ilium). This condition causes deep aching or burning pain in the low back. In some cases, the pain may also spread into one or both buttocks, hips, or thighs. What are the causes? This condition may be caused by:  Pregnancy. During pregnancy, extra stress is put on the SI joints because the pelvis widens.  Injury, such as: ? Injuries from car accidents. ? Sports-related injuries. ? Work-related injuries.  Having one leg that is shorter than the other.  Conditions that affect the joints, such as: ? Rheumatoid  arthritis. ? Gout. ? Psoriatic arthritis. ? Joint infection (septic arthritis). Sometimes, the cause of SI joint dysfunction is not known. What are the signs or symptoms? Symptoms of this condition include:  Aching or burning pain in the lower back. The pain may also spread to other areas, such as: ? Buttocks. ? Groin. ? Thighs.  Muscle spasms in or around the painful areas.  Increased pain when standing, walking, running, stair climbing, bending, or lifting. How is this diagnosed? This condition is diagnosed with a physical exam and medical history. During the exam, the health care provider may move one or both of your legs to  different positions to check for pain. Various tests may be done to confirm the diagnosis, including:  Imaging tests to look for other causes of pain. These may include: ? MRI. ? CT scan. ? Bone scan.  Diagnostic injection. A numbing medicine is injected into the SI joint using a needle. If your pain is temporarily improved or stopped after the injection, this can indicate that SI joint dysfunction is the problem. How is this treated? Treatment depends on the cause and severity of your condition. Treatment options may include:  Ice or heat applied to the lower back area after an injury. This may help reduce pain and muscle spasms.  Medicines to relieve pain or inflammation or to relax the muscles.  Wearing a back brace (sacroiliac brace) to help support the joint while your back is healing.  Physical therapy to increase muscle strength around the joint and flexibility at the joint. This may also involve learning proper body positions and ways of moving to relieve stress on the joint.  Direct manipulation of the SI joint.  Injections of steroid medicine into the joint to reduce pain and swelling.  Radiofrequency ablation to burn away nerves that are carrying pain messages from the joint.  Use of a device that provides electrical stimulation to help  reduce pain at the joint.  Surgery to put in screws and plates that limit or prevent joint motion. This is rare. Follow these instructions at home: Medicines  Take over-the-counter and prescription medicines only as told by your health care provider.  Do not drive or use heavy machinery while taking prescription pain medicine.  If you are taking prescription pain medicine, take actions to prevent or treat constipation. Your health care provider may recommend that you: ? Drink enough fluid to keep your urine pale yellow. ? Eat foods that are high in fiber, such as fresh fruits and vegetables, whole grains, and beans. ? Limit foods that are high in fat and processed sugars, such as fried or sweet foods. ? Take an over-the-counter or prescription medicine for constipation. If you have a brace:  Wear the brace as told by your health care provider. Remove it only as told by your health care provider.  Keep the brace clean.  If the brace is not waterproof: ? Do not let it get wet. ? Cover it with a watertight covering when you take a bath or a shower. Managing pain, stiffness, and swelling      Icing can help with pain and swelling. Heat may help with muscle tension or spasms. Ask your health care provider if you should use ice or heat.  If directed, put ice on the affected area: ? If you have a removable brace, remove it as told by your health care provider. ? Put ice in a plastic bag. ? Place a towel between your skin and the bag. ? Leave the ice on for 20 minutes, 2-3 times a day.  If directed, apply heat to the affected area. Use the heat source that your health care provider recommends, such as a moist heat pack or a heating pad. ? Place a towel between your skin and the heat source. ? Leave the heat on for 20-30 minutes. ? Remove the heat if your skin turns bright red. This is especially important if you are unable to feel pain, heat, or cold. You may have a greater risk of  getting burned. General instructions  Rest as needed. Ask your health care provider what activities are  safe for you.  Return to your normal activities as told by your health care provider.  Exercise as directed by your health care provider or physical therapist.  Do not use any products that contain nicotine or tobacco, such as cigarettes and e-cigarettes. These can delay bone healing. If you need help quitting, ask your health care provider.  Keep all follow-up visits as told by your health care provider. This is important. Contact a health care provider if:  Your pain is not controlled with medicine.  You have a fever.  Your pain is getting worse. Get help right away if:  You have weakness, numbness, or tingling in your legs or feet.  You lose control of your bladder or bowel. Summary  Sacroiliac joint dysfunction is a condition that causes inflammation on one or both sides of the sacroiliac (SI) joint.  This condition causes deep aching or burning pain in the low back. In some cases, the pain may also spread into one or both buttocks, hips, or thighs.  Treatment depends on the cause and severity of your condition. It may include medicines to reduce pain and swelling or to relax muscles. This information is not intended to replace advice given to you by your health care provider. Make sure you discuss any questions you have with your health care provider. Document Revised: 06/01/2018 Document Reviewed: 11/15/2017 Else[employer] Patient Education  2020 [employer].

## 2020-11-27 NOTE — Progress Notes (Signed)
[facility] Note  11/28/2020     CHIEF COMPLAINT Patient presents for Retina Evaluation   HISTORY OF PRESENT ILLNESS: Kara Carey is a 60 y.o. female who presents to the clinic today for:   HPI    Retina Evaluation    In left eye.  This started 3 weeks ago.  Associated Symptoms Flashes and Floaters.  Negative for Pain.  I, the attending physician,  performed the HPI with the patient and updated documentation appropriately.          Comments    Patient here for Retina Evaluation. Referred by Dr Jerline Pain. Patient states vision doing fine. No eye pain. 2 weeks before seeing Dr Jerline Pain started having flashes. Then about a weeks ago was reaching and a huge amount of floaters came like a flock of birds.        Last edited by Bernarda Caffey, MD on 11/28/2020 12:16 PM. (History)    pt is here on the referral of Dr. Jerline Pain for concern of fol and floaters, pt states she had this same problem 4 years ago and it resolved itself, she states she saw Dr. Jerline Pain on January 26 or 27 bc she saw a very "distinct" fol in the inner corner of her left eye, she states last Friday she reached for something on a shelf and saw floaters that looked like a "flock of birds", pt states Dr. Jerline Pain has told her that she has ARMD, but it is dry  Referring physician: Daryel Gerald, [address]  [place], [place] [address]  HISTORICAL INFORMATION:   Selected notes from the [place] Referred by Dr. Jerline Pain LEE: 02.04.22 Ocular Hx- Vitreous Degeneration OS, Dry MD OU, Cataracts OU, DES OU, Madarosis of eyelids OU.     CURRENT MEDICATIONS: No current outpatient medications on file. (Ophthalmic Drugs)   No current facility-administered medications for this visit. (Ophthalmic Drugs)   Current Outpatient Medications (Other)  Medication Sig  . acetaminophen (TYLENOL) 500 MG tablet Take 500-750 mg by mouth every 6 (six) hours as needed for moderate pain or  headache.   Marland Kitchen aspirin EC 81 MG tablet Take 81 mg by mouth daily.  . Azelaic Acid (FINACEA) 15 % cream Apply 1 application topically 2 (two) times daily. After skin is thoroughly washed and patted dry, gently but thoroughly massage a thin film of azelaic acid cream into the affected area twice daily, in the morning and evening.  . cholecalciferol (VITAMIN D3) 25 MCG (1000 UT) tablet Take 1,000 Units by mouth daily.  Marland Kitchen losartan (COZAAR) 50 MG tablet Take 50 mg by mouth daily.  . LUTEIN PO Take by mouth daily.   No current facility-administered medications for this visit. (Other)      REVIEW OF SYSTEMS: ROS    Positive for: Eyes   Last edited by Theodore Demark, COA on 11/28/2020  9:38 AM. (History)       ALLERGIES Allergies  Allergen Reactions  . Sulfa Antibiotics Anaphylaxis  . Clindamycin Diarrhea  . Levofloxacin Other (See Comments)    Unknown  . Quinolones Other (See Comments)    Nerve damage    PAST MEDICAL HISTORY Past Medical History:  Diagnosis Date  . Allergy   . Anemia   . Anxiety hx of panic attacks  . Asthma   . Bilateral leg pain   . Family history of adverse reaction to anesthesia    parent's had problem coming out of  anesthesia-hard to wake up   Past Surgical History:  Procedure Laterality Date  . LESION EXCISION  07/2002   left upper arm-local anesthesia  . ROBOTIC ASSISTED BILATERAL SALPINGO OOPHERECTOMY Left 09/27/2018   Procedure: XI ROBOTIC ASSISTED LEFT SALPINGO OOPHORECTOMY;  Surgeon: Everitt Amber, MD;  Location: [facility];  Service: Gynecology;  Laterality: Left;    FAMILY HISTORY Family History  Problem Relation Age of Onset  . Heart disease Mother   . Dementia Mother   . Hyperlipidemia Sister   . Asthma Brother   . Lymphoma Father   . Rheum arthritis Father   . Heart disease Father   . Diabetes Father   . Hypertension Sister   . Liver disease Brother     SOCIAL HISTORY Social History   Tobacco Use  . Smoking status: Never Smoker   . Smokeless tobacco: Never Used  Vaping Use  . Vaping Use: Never used  Substance Use Topics  . Alcohol use: No  . Drug use: No         OPHTHALMIC EXAM:  Base Eye Exam    Visual Acuity (Snellen - Linear)      Right Left   Dist cc 20/25 +1 20/25   Dist ph cc 20/25 +2 20/20   Correction: Glasses       Tonometry (Tonopen, 9:33 AM)      Right Left   Pressure 19 19       Pupils      Dark Light Shape React APD   Right 4 3 Round Brisk None   Left 4 3 Round Brisk None       Visual Fields (Counting fingers)      Left Right    Full Full       Extraocular Movement      Right Left    Full, Ortho Full, Ortho       Neuro/Psych    Oriented x3: Yes   Mood/Affect: Normal       Dilation    Both eyes: 1.0% Mydriacyl, 2.5% Phenylephrine @ 9:33 AM        Slit Lamp and Fundus Exam    Slit Lamp Exam      Right Left   Lids/Lashes Dermatochalasis - upper lid, mild MGD Dermatochalasis - upper lid, mild MGD   Conjunctiva/Sclera White and quiet White and quiet   Cornea 1+ Punctate epithelial erosions Trace Punctate epithelial erosions   Anterior Chamber Deep and quiet Deep and quiet   Iris Round and dilated Round and dilated   Lens 1-2+ Nuclear sclerosis, 2+ Cortical cataract, prominent spokes at 0330 and 0530 1-2+ Nuclear sclerosis, 2+ Cortical cataract   Vitreous Vitreous syneresis, Posterior vitreous detachment, mild vitreous condensations Vitreous syneresis, Posterior vitreous detachment, vitreous condensations       Fundus Exam      Right Left   Disc Pink and Sharp Pink and Sharp   C/D Ratio 0.4 0.5   Macula Flat, Good foveal reflex, Drusen Flat, Good foveal reflex, Drusen, RPE mottling, No heme or edema   Vessels attenuated, mild tortuousity attenuated, Tortuous   Periphery Attached, mild reticular degeneration, No RT/RD Attached, pigmented peripheral cystoid degeneration, No RT/RD        Refraction    Wearing Rx      Sphere Cylinder Axis   Right -3.50 +0.50  083   Left -3.50 Sphere        Manifest Refraction      Sphere Cylinder Axis Dist VA  Right -4.25 +2.00 081 20/25-2   Left -3.25 Sphere  20/25-2          IMAGING AND PROCEDURES  Imaging and Procedures for 11/28/2020  OCT, Retina - OU - Both Eyes       Right Eye Quality was good. Central Foveal Thickness: 293. Progression has no prior data. Findings include normal foveal contour, no IRF, no SRF, retinal drusen , outer retinal atrophy (Scattered drusen and focal ORA).   Left Eye Quality was good. Central Foveal Thickness: 288. Progression has no prior data. Findings include normal foveal contour, no IRF, no SRF, retinal drusen  (Scattered drusen and focal ORA, mild vitreous opacities).   Notes *Images captured and stored on drive  Diagnosis / Impression:  Non-exu ARMD OU Scattered drusen and focal ORA OU  Clinical management:  See below  Abbreviations: NFP - Normal foveal profile. CME - cystoid macular edema. PED - pigment epithelial detachment. IRF - intraretinal fluid. SRF - subretinal fluid. EZ - ellipsoid zone. ERM - epiretinal membrane. ORA - outer retinal atrophy. ORT - outer retinal tubulation. SRHM - subretinal hyper-reflective material. IRHM - intraretinal hyper-reflective material                 ASSESSMENT/PLAN:    ICD-10-CM   1. Posterior vitreous detachment of both eyes  H43.813   2. Intermediate stage nonexudative age-related macular degeneration of both eyes  H35.3132   3. Retinal edema  H35.81 OCT, Retina - OU - Both Eyes  4. Essential hypertension  I10   5. Hypertensive retinopathy of both eyes  H35.033   6. Combined forms of age-related cataract of both eyes  H25.813    1,2. PVD / vitreous syneresis OU  - symptomatic flashes and floaters OS x 2 wks  - Discussed findings and prognosis  - No RT or RD on 360 scleral depressed exam  - Reviewed s/s of RT/RD  - Strict return precautions for any such RT/RD signs/symptoms  - f/u in 4-6 wks --  DFE/OCT  3. Age related macular degeneration, non-exudative, OU  - The incidence, anatomy, and pathology of dry AMD, risk of progression, and the AREDS and AREDS 2 studies including smoking risks discussed with patient.  - Recommend amsler grid monitoring  4,5. Hypertensive retinopathy OU - discussed importance of tight BP control - monitor  6. Mixed Cataract OU - The symptoms of cataract, surgical options, and treatments and risks were discussed with patient. - discussed diagnosis and progression - not yet visually significant - monitor for now    Ophthalmic Meds Ordered this visit:  No orders of the defined types were placed in this encounter.      Return for f/u 4-6 weeks, PVD OS, DFE, OCT.  There are no Patient Instructions on file for this visit.   Explained the diagnoses, plan, and follow up with the patient and they expressed understanding.  Patient expressed understanding of the importance of proper follow up care.   This document serves as a record of services personally performed by Gardiner Sleeper, MD, PhD. It was created on their behalf by San Jetty. Owens Shark, OA an ophthalmic technician. The creation of this record is the provider's dictation and/or activities during the visit.    Electronically signed by: San Jetty. Owens Shark, [place] 02.10.2022 12:19 PM  Gardiner Sleeper, M.D., Ph.D. Diseases & Surgery of the Retina and Vitreous [facility]  I have reviewed the above documentation for accuracy and completeness, and I agree  with the above. Gardiner Sleeper, M.D., Ph.D. 11/28/20 12:19 PM   Abbreviations: M myopia (nearsighted); A astigmatism; H hyperopia (farsighted); P presbyopia; Mrx spectacle prescription;  CTL contact lenses; OD right eye; OS left eye; OU both eyes  XT exotropia; ET esotropia; PEK punctate epithelial keratitis; PEE punctate epithelial erosions; DES dry eye syndrome; MGD meibomian gland dysfunction; ATs artificial tears; PFAT's  preservative free artificial tears; [place] nuclear sclerotic cataract; PSC posterior subcapsular cataract; ERM epi-retinal membrane; PVD posterior vitreous detachment; RD retinal detachment; DM diabetes mellitus; DR diabetic retinopathy; NPDR non-proliferative diabetic retinopathy; PDR proliferative diabetic retinopathy; CSME clinically significant macular edema; DME diabetic macular edema; dbh dot blot hemorrhages; CWS cotton wool spot; POAG primary open angle glaucoma; C/D cup-to-disc ratio; HVF humphrey visual field; GVF goldmann visual field; OCT optical coherence tomography; IOP intraocular pressure; BRVO Branch retinal vein occlusion; CRVO central retinal vein occlusion; CRAO central retinal artery occlusion; BRAO branch retinal artery occlusion; RT retinal tear; SB scleral buckle; PPV pars plana vitrectomy; VH Vitreous hemorrhage; PRP panretinal laser photocoagulation; IVK intravitreal kenalog; VMT vitreomacular traction; MH Macular hole;  NVD neovascularization of the disc; NVE neovascularization elsewhere; AREDS age related eye disease study; ARMD age related macular degeneration; POAG primary open angle glaucoma; EBMD epithelial/anterior basement membrane dystrophy; ACIOL anterior chamber intraocular lens; IOL intraocular lens; PCIOL posterior chamber intraocular lens; Phaco/IOL phacoemulsification with intraocular lens placement; [place] photorefractive keratectomy; LASIK laser assisted in situ keratomileusis; HTN hypertension; DM diabetes mellitus; COPD chronic obstructive pulmonary disease

## 2020-11-28 ENCOUNTER — Encounter (INDEPENDENT_AMBULATORY_CARE_PROVIDER_SITE_OTHER): Payer: Self-pay | Admitting: Ophthalmology

## 2020-11-28 ENCOUNTER — Other Ambulatory Visit: Payer: Self-pay

## 2020-11-28 ENCOUNTER — Ambulatory Visit (INDEPENDENT_AMBULATORY_CARE_PROVIDER_SITE_OTHER): Payer: BC Managed Care – PPO | Admitting: Ophthalmology

## 2020-11-28 DIAGNOSIS — H35033 Hypertensive retinopathy, bilateral: Secondary | ICD-10-CM

## 2020-11-28 DIAGNOSIS — H43813 Vitreous degeneration, bilateral: Secondary | ICD-10-CM | POA: Diagnosis not present

## 2020-11-28 DIAGNOSIS — I10 Essential (primary) hypertension: Secondary | ICD-10-CM | POA: Diagnosis not present

## 2020-11-28 DIAGNOSIS — H353132 Nonexudative age-related macular degeneration, bilateral, intermediate dry stage: Secondary | ICD-10-CM

## 2020-11-28 DIAGNOSIS — H3581 Retinal edema: Secondary | ICD-10-CM

## 2020-11-28 DIAGNOSIS — H25813 Combined forms of age-related cataract, bilateral: Secondary | ICD-10-CM

## 2020-11-29 NOTE — Progress Notes (Signed)
Office Visit Note  Patient: Kara Carey             Date of Birth: December 28, 1960           MRN: [id number]             PCP: Forrest Moron, MD Referring: Hayden Rasmussen, MD Visit Date: 12/13/2020 Occupation: _0 @  Subjective:  Dry eyes   History of Present Illness: Kara Carey is a 60 y.o. female with history of chronic SI joint pain and DDD.  She presents today with increased dry eyes since summer 2021. She was evaluated by her ophthalmologist who recommended an eye mask at night, which has not improved her symptoms.  She states in the mornings it feels as though her eyelids are sick to her eyes.  She has continued to use Systane as well as Thera eyedrops as needed.  She denies any uveitis flares recently. She continues to experience intermittent right SI joint pain.  She has occasional episodes of sciatica after sitting for prolonged periods of time.  She uses Tiger balm as needed for symptomatic relief.  She denies any nocturnal pain or morning stiffness.  She has not had any Achilles tendinitis or plantar fasciitis. She continues to take tart cherry and vitamin D 1,000 units daily.    Activities of Daily Living:  Patient reports morning stiffness for 0 minutes.   Patient Reports nocturnal pain.  Difficulty dressing/grooming: Denies Difficulty climbing stairs: Denies Difficulty getting out of chair: Denies Difficulty using hands for taps, buttons, cutlery, and/or writing: Denies  Review of Systems  Constitutional: Negative for fatigue.  HENT: Negative for mouth sores, mouth dryness and nose dryness.   Eyes: Positive for dryness. Negative for pain and itching.  Respiratory: Negative for shortness of breath and difficulty breathing.   Cardiovascular: Negative for chest pain and palpitations.  Gastrointestinal: Negative for blood in stool, constipation and diarrhea.  Endocrine: Negative for increased urination.  Genitourinary: Negative for difficulty  urinating.  Musculoskeletal: Positive for myalgias, muscle tenderness and myalgias. Negative for arthralgias, joint pain, joint swelling and morning stiffness.  Skin: Negative for color change, rash and redness.  Allergic/Immunologic: Negative for susceptible to infections.  Neurological: Negative for dizziness, numbness, headaches, memory loss and weakness.  Hematological: Negative for bruising/bleeding tendency.  Psychiatric/Behavioral: Negative for confusion.    PMFS History:  Patient Active Problem List   Diagnosis Date Noted  . Cyst of left ovary 09/27/2018  . Endometriosis of ovary   . DDD (degenerative disc disease), lumbar 09/08/2018  . Other idiopathic scoliosis, lumbar region 09/08/2018  . History of vitamin D deficiency 08/17/2018  . History of asthma 08/17/2018  . History of anxiety 08/17/2018  . History of uveitis 08/17/2018    Past Medical History:  Diagnosis Date  . Allergy   . Anemia   . Anxiety hx of panic attacks  . Asthma   . Bilateral leg pain   . Family history of adverse reaction to anesthesia    parent's had problem coming out of anesthesia-hard to wake up    Family History  Problem Relation Age of Onset  . Heart disease Mother   . Dementia Mother   . Hyperlipidemia Sister   . Asthma Brother   . Lymphoma Father   . Rheum arthritis Father   . Heart disease Father   . Diabetes Father   . Hypertension Sister   . Liver disease Brother    Past Surgical History:  Procedure Laterality Date  .  LESION EXCISION  07/2002   left upper arm-local anesthesia  . ROBOTIC ASSISTED BILATERAL SALPINGO OOPHERECTOMY Left 09/27/2018   Procedure: XI ROBOTIC ASSISTED LEFT SALPINGO OOPHORECTOMY;  Surgeon: Everitt Amber, MD;  Location: [facility];  Service: Gynecology;  Laterality: Left;   Social History   Social History Narrative  . Not on file   Immunization History  Administered Date(s) Administered  . Influenza-Unspecified 09/11/2019  . PFIZER(Purple  Top)SARS-COV-2 Vaccination 01/05/2020, 01/26/2020, 09/10/2020     Objective: Vital Signs: BP 134/84 (BP Location: Right Arm, Patient Position: Sitting, Cuff Size: Normal)   Pulse 65   Resp 16   Ht 5' 3.5" (1.613 m)   Wt 187 lb (84.8 kg)   BMI 32.61 kg/m    Physical Exam Vitals and nursing note reviewed.  Constitutional:      Appearance: She is well-developed and well-nourished.  HENT:     Head: Normocephalic and atraumatic.  Eyes:     Extraocular Movements: EOM normal.     Conjunctiva/sclera: Conjunctivae normal.  Cardiovascular:     Pulses: Intact distal pulses.  Pulmonary:     Effort: Pulmonary effort is normal.  Abdominal:     Palpations: Abdomen is soft.  Musculoskeletal:     Cervical back: Normal range of motion.  Skin:    General: Skin is warm and dry.     Capillary Refill: Capillary refill takes less than 2 seconds.  Neurological:     Mental Status: She is alert and oriented to person, place, and time.  Psychiatric:        Mood and Affect: Mood and affect normal.        Behavior: Behavior normal.      Musculoskeletal Exam: C-spine, thoracic spine, lumbar spine have good range of motion with no discomfort.  She has tenderness over the right SI joint.  Shoulder joints, elbow joints, wrist joints, MCPs, PIPs, DIPs have good range of motion with no synovitis.  She is able to make a complete fist bilaterally.  Hip joints have good range of motion with no discomfort.  She has some tenderness along the IT band of the left leg.  Knee joints have good range of motion with no warmth or effusion.  Ankle joints have good range of motion with no tenderness or inflammation.  No tenderness along the Achilles tendon.  No calf tightness or tenderness noted.  CDAI Exam: CDAI Score: - Patient Global: -; Provider Global: - Swollen: 0 ; Tender: 1  Joint Exam 12/13/2020      Right  Left  Sacroiliac   Tender        Investigation: No additional findings.  Imaging: OCT, Retina  - OU - Both Eyes  Result Date: 11/28/2020 Right Eye Quality was good. Central Foveal Thickness: 293. Progression has no prior data. Findings include normal foveal contour, no IRF, no SRF, retinal drusen , outer retinal atrophy (Scattered drusen and focal ORA). Left Eye Quality was good. Central Foveal Thickness: 288. Progression has no prior data. Findings include normal foveal contour, no IRF, no SRF, retinal drusen  (Scattered drusen and focal ORA, mild vitreous opacities). Notes *Images captured and stored on drive Diagnosis / Impression: Non-exu ARMD OU Scattered drusen and focal ORA OU Clinical management: See below Abbreviations: NFP - Normal foveal profile. CME - cystoid macular edema. PED - pigment epithelial detachment. IRF - intraretinal fluid. SRF - subretinal fluid. EZ - ellipsoid zone. ERM - epiretinal membrane. ORA - outer retinal atrophy. ORT - outer retinal tubulation. SRHM -  subretinal hyper-reflective material. IRHM - intraretinal hyper-reflective material    Recent Labs: Lab Results  Component Value Date   WBC 5.8 09/19/2018   HGB 13.0 09/19/2018   PLT 348 09/19/2018   NA 141 09/19/2018   K 4.4 09/19/2018   CL 105 09/19/2018   CO2 28 09/19/2018   GLUCOSE 90 09/19/2018   BUN 20 09/19/2018   CREATININE 0.81 09/19/2018   BILITOT 0.8 09/19/2018   ALKPHOS 75 09/19/2018   AST 21 09/19/2018   ALT 19 09/19/2018   PROT 7.6 09/19/2018   ALBUMIN 4.1 09/19/2018   CALCIUM 9.3 09/19/2018   GFRAA >60 09/19/2018    Speciality Comments: No specialty comments available.  Procedures:  No procedures performed Allergies: Sulfa antibiotics, Clindamycin, Levofloxacin, and Quinolones   Assessment / Plan:     Visit Diagnoses: Chronic SI joint pain - HLA-B27+, Hx of uveitis (age 71), X-rays of the pelvis were obtained on 08/17/2018 which revealed bilateral SI joint sclerosis and mild narrowing of the left SI joint.  She had a MRI of the sacrum performed on 08/27/2018 which was consistent  with bilateral SI joint degenerative changes, left greater than right.  No significant joint effusion but small erosions were suspected. She continues to have intermittent SI joint pain bilaterally.  On examination today she has tenderness over the right SI joint.  Her SI discomfort is exacerbated by sitting for prolonged periods of time.  We discussed the importance of changing positions frequently and performing SI joint stretching exercises on a daily basis.  She has not been experiencing increased morning stiffness or nocturnal pain.  She has no other signs of inflammatory arthritis at this time. No plantar fasciitis or achilles tendonitis.  No uveitis flares (initial flare at age 37).  She does not require immunosuppressive therapy currently.  She was given a handout of back exercises and information about SI joint dysfunction.  She was also given a list of natural anti-inflammatories to start taking.  She was advised to notify us if her discomfort persists or worsens. She will follow up in the office in 6 months.   HLA B27 positive  DDD (degenerative disc disease), lumbar -X-rays of the lumbar spine were obtained on 08/17/2018: findings are consistent with levoscoliosis, spondylosis  and facet joint arthropathy.  No syndesmophytes were noted.  She has no midline spinal tenderness on exam. She was given a handout of back exercises to perform.   Other idiopathic scoliosis, lumbar region: Levoscoliosis apparent on x-rays from 08/17/18.   History of uveitis - Patient developed uveitis at age 58. 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  No conjunctival injection was noted on examination today.  She is followed closely by her ophthalmologist and retina specialist.  She has a history of macular degeneration.  She has not had any recent uveitis flares.  She has noticed an increased frequency of floaters and was evaluated by the retina specialist today.  No retinal detachment noted.  Her retina specialist recommended continued  surveillance.  Sicca syndrome ([place]) -She has been experiencing increased eye dryness since summer 2021. She has ongoing mouth dryness.  She has been evaluated by her ophthalmologist and retina specialist.  No signs of a uveitis flare.  She has been using systane and theradrops on a daily basis.  She has also tried an eye mask at night with no improvement in her symptoms.  She was encouraged to try a humidifier in her bedroom and to avoid use of a fan at night.  She  can also try a gel lubricating drop at bedtime to alleviate her symptoms.  Discussed increasing intake of antioxidants and omega 3.  She was also given a list of natural antiinflammatories to start taking.  She is not a good candidate for pilocarpine due to history of asthma.  We will obtain the following lab work today.  She was advised to notify us if she develops any new or worsening symptoms.    Plan: ANA, Sedimentation rate, Sjogrens syndrome-A extractable nuclear antibody, Sjogrens syndrome-B extractable nuclear antibody  History of vitamin D deficiency: She is taking vitamin D 1,000 units daily.   History of anxiety  History of asthma: She will not be a good candidate for pilocarpine in the future due to history of asthma.     Orders: Orders Placed This Encounter  Procedures  . ANA  . Sedimentation rate  . Sjogrens syndrome-A extractable nuclear antibody  . Sjogrens syndrome-B extractable nuclear antibody   No orders of the defined types were placed in this encounter.     Follow-Up Instructions: Return in about 6 months (around 06/12/2021) for Chronic SI joint pain, DDD.   Kara Neas, PA-C  Note - This record has been created using Dragon software.  Chart creation errors have been sought, but may not always  have been located. Such creation errors do not reflect on  the standard of medical care.

## 2020-12-13 ENCOUNTER — Ambulatory Visit (INDEPENDENT_AMBULATORY_CARE_PROVIDER_SITE_OTHER): Payer: BC Managed Care – PPO | Admitting: Physician Assistant

## 2020-12-13 ENCOUNTER — Encounter: Payer: Self-pay | Admitting: Physician Assistant

## 2020-12-13 ENCOUNTER — Other Ambulatory Visit: Payer: Self-pay

## 2020-12-13 VITALS — BP 134/84 | HR 65 | Resp 16 | Ht 63.5 in | Wt 187.0 lb

## 2020-12-13 DIAGNOSIS — M5136 Other intervertebral disc degeneration, lumbar region: Secondary | ICD-10-CM | POA: Diagnosis not present

## 2020-12-13 DIAGNOSIS — Z6834 Body mass index (BMI) 34.0-34.9, adult: Secondary | ICD-10-CM

## 2020-12-13 DIAGNOSIS — M4126 Other idiopathic scoliosis, lumbar region: Secondary | ICD-10-CM | POA: Diagnosis not present

## 2020-12-13 DIAGNOSIS — G8929 Other chronic pain: Secondary | ICD-10-CM

## 2020-12-13 DIAGNOSIS — M3500 Sicca syndrome, unspecified: Secondary | ICD-10-CM

## 2020-12-13 DIAGNOSIS — Z8669 Personal history of other diseases of the nervous system and sense organs: Secondary | ICD-10-CM

## 2020-12-13 DIAGNOSIS — Z8709 Personal history of other diseases of the respiratory system: Secondary | ICD-10-CM

## 2020-12-13 DIAGNOSIS — M533 Sacrococcygeal disorders, not elsewhere classified: Secondary | ICD-10-CM

## 2020-12-13 DIAGNOSIS — Z1589 Genetic susceptibility to other disease: Secondary | ICD-10-CM | POA: Diagnosis not present

## 2020-12-13 DIAGNOSIS — Z8659 Personal history of other mental and behavioral disorders: Secondary | ICD-10-CM

## 2020-12-13 DIAGNOSIS — Z8639 Personal history of other endocrine, nutritional and metabolic disease: Secondary | ICD-10-CM

## 2020-12-13 NOTE — Patient Instructions (Signed)
Sacroiliac Joint Dysfunction  Sacroiliac joint dysfunction is a condition that causes inflammation on one or both sides of the sacroiliac (SI) joint. The SI joint is the joint between two bones of the pelvis called the sacrum and the ilium. The sacrum is the bone at the base of the spine. The ilium is the large bone that forms the hip. This condition causes deep aching or burning pain in the low back. In some cases, the pain may also spread into one or both buttocks, hips, or thighs. What are the causes? This condition may be caused by:  Pregnancy. During pregnancy, extra stress is put on the SI joints because the pelvis widens.  Injury, such as: ? Injuries from car crashes. ? Sports-related injuries. ? Work-related injuries.  Having one leg that is shorter than the other.  Conditions that affect the joints, such as: ? Rheumatoid arthritis. ? Gout. ? Psoriatic arthritis. ? Joint infection (septic arthritis). Sometimes, the cause of SI joint dysfunction is not known. What are the signs or symptoms? Symptoms of this condition include:  Aching or burning pain in the lower back. The pain may also spread to other areas, such as: ? Buttocks. ? Groin. ? Thighs.  Muscle spasms in or around the painful areas.  Increased pain when standing, walking, running, stair climbing, bending, or lifting. How is this diagnosed? This condition is diagnosed with a physical exam and your medical history. During the exam, the health care provider may move one or both of your legs to different positions to check for pain. Various tests may be done to confirm the diagnosis, including:  Imaging tests to look for other causes of pain. These may include: ? MRI. ? CT scan. ? Bone scan.  Diagnostic injection. A numbing medicine is injected into the SI joint using a needle. If your pain is temporarily improved or stopped after the injection, this can indicate that SI joint dysfunction is the problem. How is  this treated? Treatment depends on the cause and severity of your condition. Treatment options can be noninvasive and may include:  Ice or heat applied to the lower back area after an injury. This may help reduce pain and muscle spasms.  Medicines to relieve pain or inflammation or to relax the muscles.  Wearing a back brace (sacroiliac brace) to help support the joint while your back is healing.  Physical therapy to increase muscle strength around the joint and flexibility at the joint. This may also involve learning proper body positions and ways of moving to relieve stress on the joint.  Direct manipulation of the SI joint.  Use of a device that provides electrical stimulation to help reduce pain at the joint. Other treatments may include:  Injections of steroid medicine into the joint to reduce pain and swelling.  Radiofrequency ablation. This treatment uses heat to burn away nerves that are carrying pain messages from the joint.  Surgery to put in screws and plates that limit or prevent joint motion. This is rare. Follow these instructions at home: Medicines  Take over-the-counter and prescription medicines only as told by your health care provider.  Ask your health care provider if the medicine prescribed to you: ? Requires you to avoid driving or using machinery. ? Can cause constipation. You may need to take these actions to prevent or treat constipation:  Drink enough fluid to keep your urine pale yellow.  Take over-the-counter or prescription medicines.  Eat foods that are high in fiber, such as beans,   whole grains, and fresh fruits and vegetables.  Limit foods that are high in fat and processed sugars, such as fried or sweet foods. If you have a brace:  Wear the brace as told by your health care provider. Remove it only as told by your health care provider.  Keep the brace clean.  If the brace is not waterproof: ? Do not let it get wet. ? Cover it with a  watertight covering when you take a bath or a shower. Managing pain, stiffness, and swelling  Icing can help with pain and swelling. Heat may help with muscle tension or spasms. Ask your health care provider if you should use ice or heat.  If directed, put ice on the affected area: ? If you have a removable brace, remove it as told by your health care provider. ? Put ice in a plastic bag. ? Place a towel between your skin and the bag. ? Leave the ice on for 20 minutes, 2-3 times a day. ? Remove the ice if your skin turns bright red. This is very important. If you cannot feel pain, heat, or cold, you have a greater risk of damage to the area.  If directed, apply heat to the affected area as often as told by your health care provider. Use the heat source that your health care provider recommends, such as a moist heat pack or a heating pad. ? Place a towel between your skin and the heat source. ? Leave the heat on for 20-30 minutes. ? Remove the heat if your skin turns bright red. This is especially important if you are unable to feel pain, heat, or cold. You may have a greater risk of getting burned.      General instructions  Rest as needed. Return to your normal activities as told by your health care provider. Ask your health care provider what activities are safe for you.  Do exercises as told by your health care provider or physical therapist.  Keep all follow-up visits. This is important. Contact a health care provider if:  Your pain is not controlled with medicine.  You have a fever.  Your pain is getting worse. Get help right away if:  You have weakness, numbness, or tingling in your legs or feet.  You lose control of your bladder or bowels. Summary  Sacroiliac (SI) joint dysfunction is a condition that causes inflammation on one or both sides of the SI joint.  This condition causes deep aching or burning pain in the low back. In some cases, the pain may also spread into  one or both buttocks, hips, or thighs.  Treatment depends on the cause and severity of your condition. It may include medicines to reduce pain and swelling or to relax muscles. This information is not intended to replace advice given to you by your health care provider. Make sure you discuss any questions you have with your health care provider. Document Revised: 02/15/2020 Document Reviewed: 02/15/2020 Elsevier Patient Education  [place]. Back Exercises These exercises help to make your trunk and back strong. They also help to keep the lower back flexible. Doing these exercises can help to prevent back pain or lessen existing pain.  If you have back pain, try to do these exercises 2-3 times each day or as told by your doctor.  As you get better, do the exercises once each day. Repeat the exercises more often as told by your doctor.  To stop back pain from coming  back, do the exercises once each day, or as told by your doctor. Exercises Single knee to chest Do these steps 3-5 times in a row for each leg: 1. Lie on your back on a firm bed or the floor with your legs stretched out. 2. Bring one knee to your chest. 3. Grab your knee or thigh with both hands and hold them it in place. 4. Pull on your knee until you feel a gentle stretch in your lower back or buttocks. 5. Keep doing the stretch for 10-30 seconds. 6. Slowly let go of your leg and straighten it. Pelvic tilt Do these steps 5-10 times in a row: 1. Lie on your back on a firm bed or the floor with your legs stretched out. 2. Bend your knees so they point up to the ceiling. Your feet should be flat on the floor. 3. Tighten your lower belly (abdomen) muscles to press your lower back against the floor. This will make your tailbone point up to the ceiling instead of pointing down to your feet or the floor. 4. Stay in this position for 5-10 seconds while you gently tighten your muscles and breathe evenly. Cat-cow Do these  steps until your lower back bends more easily: 1. Get on your hands and knees on a firm surface. Keep your hands under your shoulders, and keep your knees under your hips. You may put padding under your knees. 2. Let your head hang down toward your chest. Tighten (contract) the muscles in your belly. Point your tailbone toward the floor so your lower back becomes rounded like the back of a cat. 3. Stay in this position for 5 seconds. 4. Slowly lift your head. Let the muscles of your belly relax. Point your tailbone up toward the ceiling so your back forms a sagging arch like the back of a cow. 5. Stay in this position for 5 seconds.   Press-ups Do these steps 5-10 times in a row: 1. Lie on your belly (face-down) on the floor. 2. Place your hands near your head, about shoulder-width apart. 3. While you keep your back relaxed and keep your hips on the floor, slowly straighten your arms to raise the top half of your body and lift your shoulders. Do not use your back muscles. You may change where you place your hands in order to make yourself more comfortable. 4. Stay in this position for 5 seconds. 5. Slowly return to lying flat on the floor.   Bridges Do these steps 10 times in a row: 1. Lie on your back on a firm surface. 2. Bend your knees so they point up to the ceiling. Your feet should be flat on the floor. Your arms should be flat at your sides, next to your body. 3. Tighten your butt muscles and lift your butt off the floor until your waist is almost as high as your knees. If you do not feel the muscles working in your butt and the back of your thighs, slide your feet 1-2 inches farther away from your butt. 4. Stay in this position for 3-5 seconds. 5. Slowly lower your butt to the floor, and let your butt muscles relax. If this exercise is too easy, try doing it with your arms crossed over your chest.   Belly crunches Do these steps 5-10 times in a row: 1. Lie on your back on a firm bed  or the floor with your legs stretched out. 2. Bend your knees so they point up  to the ceiling. Your feet should be flat on the floor. 3. Cross your arms over your chest. 4. Tip your chin a little bit toward your chest but do not bend your neck. 5. Tighten your belly muscles and slowly raise your chest just enough to lift your shoulder blades a tiny bit off of the floor. Avoid raising your body higher than that, because it can put too much stress on your low back. 6. Slowly lower your chest and your head to the floor. Back lifts Do these steps 5-10 times in a row: 1. Lie on your belly (face-down) with your arms at your sides, and rest your forehead on the floor. 2. Tighten the muscles in your legs and your butt. 3. Slowly lift your chest off of the floor while you keep your hips on the floor. Keep the back of your head in line with the curve in your back. Look at the floor while you do this. 4. Stay in this position for 3-5 seconds. 5. Slowly lower your chest and your face to the floor. Contact a doctor if:  Your back pain gets a lot worse when you do an exercise.  Your back pain does not get better 2 hours after you exercise. If you have any of these problems, stop doing the exercises. Do not do them again unless your doctor says it is okay. Get help right away if:  You have sudden, very bad back pain. If this happens, stop doing the exercises. Do not do them again unless your doctor says it is okay. This information is not intended to replace advice given to you by your health care provider. Make sure you discuss any questions you have with your health care provider. Document Revised: 06/30/2018 Document Reviewed: 06/30/2018 Elsevier Patient Education  2021 [employer].

## 2020-12-16 LAB — ANA: Anti Nuclear Antibody (ANA): NEGATIVE

## 2020-12-16 LAB — SJOGRENS SYNDROME-B EXTRACTABLE NUCLEAR ANTIBODY: SSB (La) (ENA) Antibody, IgG: <1 AI

## 2020-12-16 LAB — SEDIMENTATION RATE: Sed Rate: 25 mm/h (ref 0–30)

## 2020-12-16 LAB — SJOGRENS SYNDROME-A EXTRACTABLE NUCLEAR ANTIBODY: SSA (Ro) (ENA) Antibody, IgG: <1 AI

## 2020-12-16 NOTE — Progress Notes (Signed)
ANA negative, Ro antibody negative, and La antibody negative.

## 2020-12-16 NOTE — Progress Notes (Signed)
ESR WNL

## 2021-01-03 ENCOUNTER — Encounter (INDEPENDENT_AMBULATORY_CARE_PROVIDER_SITE_OTHER): Payer: BC Managed Care – PPO | Admitting: Ophthalmology

## 2021-01-07 NOTE — Progress Notes (Signed)
[facility] Note  01/10/2021     CHIEF COMPLAINT Patient presents for Retina Follow Up   HISTORY OF PRESENT ILLNESS: Kara Carey is a 60 y.o. female who presents to the clinic today for:   HPI    Retina Follow Up    Patient presents with  PVD.  In both eyes.  Severity is moderate.  Since onset it is gradually improving.  I, the attending physician,  performed the HPI with the patient and updated documentation appropriately.          Comments    Pt states vision is stable, she states her eyes stay dry so she has to blink a lot to clear them up, fol are diminishing, almost gone, floaters are still there, but no new ones       Last edited by Bernarda Caffey, MD on 01/10/2021  1:33 PM. (History)    pt feels like the fol are getting less frequent, she states she is seeing Dr. Jerline Pain every year and she follows her ARMD  Referring physician: Daryel Gerald, [address]  [place],  [address]  HISTORICAL INFORMATION:   Selected notes from the MEDICAL RECORD NUMBER Referred by Dr. Jerline Pain LEE: 02.04.22 Ocular Hx- Vitreous Degeneration OS, Dry MD OU, Cataracts OU, DES OU, Madarosis of eyelids OU.     CURRENT MEDICATIONS: No current outpatient medications on file. (Ophthalmic Drugs)   No current facility-administered medications for this visit. (Ophthalmic Drugs)   Current Outpatient Medications (Other)  Medication Sig  . acetaminophen (TYLENOL) 500 MG tablet Take 500-750 mg by mouth every 6 (six) hours as needed for moderate pain or headache.   Marland Kitchen aspirin EC 81 MG tablet Take 81 mg by mouth as needed.  . Azelaic Acid 15 % cream Apply 1 application topically 2 (two) times daily. After skin is thoroughly washed and patted dry, gently but thoroughly massage a thin film of azelaic acid cream into the affected area twice daily, in the morning and evening.  . cholecalciferol (VITAMIN D3) 25 MCG (1000 UT) tablet Take 1,000 Units by mouth  daily.  . finasteride (PROSCAR) 5 MG tablet Take 2.5 mg by mouth daily.  Marland Kitchen losartan (COZAAR) 50 MG tablet Take 50 mg by mouth daily.  . LUTEIN PO Take by mouth daily.  Marland Kitchen TART CHERRY PO Take by mouth daily.   No current facility-administered medications for this visit. (Other)      REVIEW OF SYSTEMS: ROS    Positive for: Musculoskeletal, Eyes, Heme/Lymph   Negative for: Constitutional, Gastrointestinal, Neurological, Skin, Genitourinary, HENT, Endocrine, Cardiovascular, Respiratory, Psychiatric, Allergic/Imm   Last edited by Debbrah Alar, COT on 01/10/2021  1:24 PM. (History)       ALLERGIES Allergies  Allergen Reactions  . Sulfa Antibiotics Anaphylaxis  . Clindamycin Diarrhea  . Levofloxacin Other (See Comments)    Unknown  . Quinolones Other (See Comments)    Nerve damage    PAST MEDICAL HISTORY Past Medical History:  Diagnosis Date  . Allergy   . Anemia   . Anxiety hx of panic attacks  . Asthma   . Bilateral leg pain   . Family history of adverse reaction to anesthesia    parent's had problem coming out of anesthesia-hard to wake up   Past Surgical History:  Procedure Laterality Date  . LESION EXCISION  07/2002   left upper arm-local anesthesia  . ROBOTIC ASSISTED BILATERAL SALPINGO OOPHERECTOMY Left 09/27/2018  Procedure: XI ROBOTIC ASSISTED LEFT SALPINGO OOPHORECTOMY;  Surgeon: Everitt Amber, MD;  Location: [facility];  Service: Gynecology;  Laterality: Left;    FAMILY HISTORY Family History  Problem Relation Age of Onset  . Heart disease Mother   . Dementia Mother   . Hyperlipidemia Sister   . Asthma Brother   . Lymphoma Father   . Rheum arthritis Father   . Heart disease Father   . Diabetes Father   . Hypertension Sister   . Liver disease Brother     SOCIAL HISTORY Social History   Tobacco Use  . Smoking status: Never Smoker  . Smokeless tobacco: Never Used  Vaping Use  . Vaping Use: Never used  Substance Use Topics  . Alcohol use: No  .  Drug use: No         OPHTHALMIC EXAM:  Base Eye Exam    Visual Acuity (Snellen - Linear)      Right Left   Dist cc 20/25 -2 20/20 -2   Dist ph cc NI 20/20   Correction: Glasses       Tonometry (Tonopen, 1:31 PM)      Right Left   Pressure 17 19       Pupils      Dark Light Shape React APD   Right 4 3 Round Brisk None   Left 4 3 Round Brisk None       Visual Fields      Left Right    Full Full       Extraocular Movement      Right Left    Full, Ortho Full, Ortho       Neuro/Psych    Oriented x3: Yes   Mood/Affect: Normal       Dilation    Both eyes: 1.0% Mydriacyl, 2.5% Phenylephrine @ 1:31 PM        Slit Lamp and Fundus Exam    Slit Lamp Exam      Right Left   Lids/Lashes Dermatochalasis - upper lid, mild MGD Dermatochalasis - upper lid, mild MGD   Conjunctiva/Sclera White and quiet White and quiet   Cornea trace Punctate epithelial erosions, mild tear film debris trace Punctate epithelial erosions, mild tear film debris   Anterior Chamber Deep and quiet Deep and quiet   Iris Round and dilated Round and dilated   Lens 2+ Nuclear sclerosis, 3+ Cortical cataract, prominent spokes at 0330 and 0530 2+ Nuclear sclerosis, 3+ Cortical cataract   Vitreous Vitreous syneresis, Posterior vitreous detachment, mild vitreous condensations Vitreous syneresis, Posterior vitreous detachment, vitreous condensations       Fundus Exam      Right Left   Disc Pink and Sharp Pink and Sharp, Compact   C/D Ratio 0.5 0.5   Macula Flat, Good foveal reflex, Drusen, RPE mottling and clumping Flat, Good foveal reflex, Drusen, RPE mottling and clumping, No heme or edema   Vessels attenuated, mild tortuousity attenuated, Tortuous, mild AV crossing changes   Periphery Attached, mild reticular degeneration, No RT/RD Attached, pigmented peripheral cystoid degeneration, No RT/RD          IMAGING AND PROCEDURES  Imaging and Procedures for 01/10/2021  OCT, Retina - OU - Both Eyes        Right Eye Quality was good. Central Foveal Thickness: 299. Progression has been stable. Findings include normal foveal contour, no IRF, no SRF, retinal drusen , outer retinal atrophy (Scattered drusen and focal ORA -- stable from prior).   Left  Eye Quality was good. Central Foveal Thickness: 295. Progression has improved. Findings include normal foveal contour, no IRF, no SRF, retinal drusen  (Scattered drusen and focal ORA, mild interval improvement in vitreous opacities).   Notes *Images captured and stored on drive  Diagnosis / Impression:  Non-exu ARMD OU Scattered drusen and focal ORA OU OS: mild interval improvement in vitreous opacities  Clinical management:  See below  Abbreviations: NFP - Normal foveal profile. CME - cystoid macular edema. PED - pigment epithelial detachment. IRF - intraretinal fluid. SRF - subretinal fluid. EZ - ellipsoid zone. ERM - epiretinal membrane. ORA - outer retinal atrophy. ORT - outer retinal tubulation. SRHM - subretinal hyper-reflective material. IRHM - intraretinal hyper-reflective material                 ASSESSMENT/PLAN:    ICD-10-CM   1. Posterior vitreous detachment of both eyes  H43.813   2. Intermediate stage nonexudative age-related macular degeneration of both eyes  H35.3132   3. Retinal edema  H35.81 OCT, Retina - OU - Both Eyes  4. Essential hypertension  I10   5. Hypertensive retinopathy of both eyes  H35.033   6. Combined forms of age-related cataract of both eyes  H25.813    1,2. PVD / vitreous syneresis OU  - symptomatic flashes and floaters OS improved significantly  - Discussed findings and prognosis  - No RT or RD on 360 scleral depressed exam  - Reviewed s/s of RT/RD  - Strict return precautions for any such RT/RD signs/symptoms  - pt is cleared from a retina standpoint for release to Dr. Daryel Gerald and resumption of primary eye care  3. Age related macular degeneration, non-exudative, OU  -  intermediate stage   - The incidence, anatomy, and pathology of dry AMD, risk of progression, and the AREDS and AREDS 2 studies including smoking risks discussed with patient.  - pt states that Preservision caused GI symptoms -- advised tring a different brand of AREDS 2  - Recommend amsler grid monitoring  - being monitored by Dr. Jerline Pain  4,5. Hypertensive retinopathy OU - discussed importance of tight BP control - monitor  6. Mixed Cataract OU - The symptoms of cataract, surgical options, and treatments and risks were discussed with patient. - discussed diagnosis and progression  Ophthalmic Meds Ordered this visit:  No orders of the defined types were placed in this encounter.      Return if symptoms worsen or fail to improve.  There are no Patient Instructions on file for this visit.   Explained the diagnoses, plan, and follow up with the patient and they expressed understanding.  Patient expressed understanding of the importance of proper follow up care.   This document serves as a record of services personally performed by Gardiner Sleeper, MD, PhD. It was created on their behalf by San Jetty. Owens Shark, OA an ophthalmic technician. The creation of this record is the provider's dictation and/or activities during the visit.    Electronically signed by: San Jetty. Owens Shark, [place] 03.22.2022 4:23 PM  Gardiner Sleeper, M.D., Ph.D. Diseases & Surgery of the Retina and Vitreous [facility]  I have reviewed the above documentation for accuracy and completeness, and I agree with the above. Gardiner Sleeper, M.D., Ph.D. 01/10/21 4:23 PM  Abbreviations: M myopia (nearsighted); A astigmatism; H hyperopia (farsighted); P presbyopia; Mrx spectacle prescription;  CTL contact lenses; OD right eye; OS left eye; OU both eyes  XT exotropia; ET  esotropia; PEK punctate epithelial keratitis; PEE punctate epithelial erosions; DES dry eye syndrome; MGD meibomian gland dysfunction; ATs  artificial tears; PFAT's preservative free artificial tears; [place] nuclear sclerotic cataract; PSC posterior subcapsular cataract; ERM epi-retinal membrane; PVD posterior vitreous detachment; RD retinal detachment; DM diabetes mellitus; DR diabetic retinopathy; NPDR non-proliferative diabetic retinopathy; PDR proliferative diabetic retinopathy; CSME clinically significant macular edema; DME diabetic macular edema; dbh dot blot hemorrhages; CWS cotton wool spot; POAG primary open angle glaucoma; C/D cup-to-disc ratio; HVF humphrey visual field; GVF goldmann visual field; OCT optical coherence tomography; IOP intraocular pressure; BRVO Branch retinal vein occlusion; CRVO central retinal vein occlusion; CRAO central retinal artery occlusion; BRAO branch retinal artery occlusion; RT retinal tear; SB scleral buckle; PPV pars plana vitrectomy; VH Vitreous hemorrhage; PRP panretinal laser photocoagulation; IVK intravitreal kenalog; VMT vitreomacular traction; MH Macular hole;  NVD neovascularization of the disc; NVE neovascularization elsewhere; AREDS age related eye disease study; ARMD age related macular degeneration; POAG primary open angle glaucoma; EBMD epithelial/anterior basement membrane dystrophy; ACIOL anterior chamber intraocular lens; IOL intraocular lens; PCIOL posterior chamber intraocular lens; Phaco/IOL phacoemulsification with intraocular lens placement; [place] photorefractive keratectomy; LASIK laser assisted in situ keratomileusis; HTN hypertension; DM diabetes mellitus; COPD chronic obstructive pulmonary disease

## 2021-01-10 ENCOUNTER — Other Ambulatory Visit: Payer: Self-pay

## 2021-01-10 ENCOUNTER — Ambulatory Visit (INDEPENDENT_AMBULATORY_CARE_PROVIDER_SITE_OTHER): Payer: BC Managed Care – PPO | Admitting: Ophthalmology

## 2021-01-10 ENCOUNTER — Encounter (INDEPENDENT_AMBULATORY_CARE_PROVIDER_SITE_OTHER): Payer: Self-pay | Admitting: Ophthalmology

## 2021-01-10 DIAGNOSIS — H3581 Retinal edema: Secondary | ICD-10-CM

## 2021-01-10 DIAGNOSIS — H35033 Hypertensive retinopathy, bilateral: Secondary | ICD-10-CM

## 2021-01-10 DIAGNOSIS — H43813 Vitreous degeneration, bilateral: Secondary | ICD-10-CM | POA: Diagnosis not present

## 2021-01-10 DIAGNOSIS — H353132 Nonexudative age-related macular degeneration, bilateral, intermediate dry stage: Secondary | ICD-10-CM | POA: Diagnosis not present

## 2021-01-10 DIAGNOSIS — I10 Essential (primary) hypertension: Secondary | ICD-10-CM | POA: Diagnosis not present

## 2021-01-10 DIAGNOSIS — H25813 Combined forms of age-related cataract, bilateral: Secondary | ICD-10-CM

## 2021-05-30 NOTE — Progress Notes (Deleted)
Office Visit Note  Patient: Kara Carey             Date of Birth: Feb 20, 1961           MRN: [id number]             PCP: Forrest Moron, MD Referring: Forrest Moron, MD Visit Date: 06/13/2021 Occupation: '@GUAROCC'$ @  Subjective:  No chief complaint on file.   History of Present Illness: Kara Carey is a 60 y.o. female ***   Activities of Daily Living:  Patient reports morning stiffness for *** {minute/hour:19697}.   Patient {ACTIONS;DENIES/REPORTS:21021675::"Denies"} nocturnal pain.  Difficulty dressing/grooming: {ACTIONS;DENIES/REPORTS:21021675::"Denies"} Difficulty climbing stairs: {ACTIONS;DENIES/REPORTS:21021675::"Denies"} Difficulty getting out of chair: {ACTIONS;DENIES/REPORTS:21021675::"Denies"} Difficulty using hands for taps, buttons, cutlery, and/or writing: {ACTIONS;DENIES/REPORTS:21021675::"Denies"}  No Rheumatology ROS completed.   PMFS History:  Patient Active Problem List   Diagnosis Date Noted   Cyst of left ovary 09/27/2018   Endometriosis of ovary    DDD (degenerative disc disease), lumbar 09/08/2018   Other idiopathic scoliosis, lumbar region 09/08/2018   History of vitamin D deficiency 08/17/2018   History of asthma 08/17/2018   History of anxiety 08/17/2018   History of uveitis 08/17/2018    Past Medical History:  Diagnosis Date   Allergy    Anemia    Anxiety hx of panic attacks   Asthma    Bilateral leg pain    Family history of adverse reaction to anesthesia    parent's had problem coming out of anesthesia-hard to wake up    Family History  Problem Relation Age of Onset   Heart disease Mother    Dementia Mother    Hyperlipidemia Sister    Asthma Brother    Lymphoma Father    Rheum arthritis Father    Heart disease Father    Diabetes Father    Hypertension Sister    Liver disease Brother    Past Surgical History:  Procedure Laterality Date   LESION EXCISION  07/2002   left upper arm-local anesthesia    ROBOTIC ASSISTED BILATERAL SALPINGO OOPHERECTOMY Left 09/27/2018   Procedure: XI ROBOTIC ASSISTED LEFT SALPINGO OOPHORECTOMY;  Surgeon: Everitt Amber, MD;  Location: [facility];  Service: Gynecology;  Laterality: Left;   Social History   Social History Narrative   Not on file   Immunization History  Administered Date(s) Administered   Influenza-Unspecified 09/11/2019   PFIZER(Purple Top)SARS-COV-2 Vaccination 01/05/2020, 01/26/2020, 09/10/2020     Objective: Vital Signs: There were no vitals taken for this visit.   Physical Exam   Musculoskeletal Exam: ***  CDAI Exam: CDAI Score: -- Patient Global: --; Provider Global: -- Swollen: --; Tender: -- Joint Exam 06/13/2021   No joint exam has been documented for this visit   There is currently no information documented on the homunculus. Go to the Rheumatology activity and complete the homunculus joint exam.  Investigation: No additional findings.  Imaging: No results found.  Recent Labs: Lab Results  Component Value Date   WBC 5.8 09/19/2018   HGB 13.0 09/19/2018   PLT 348 09/19/2018   NA 141 09/19/2018   K 4.4 09/19/2018   CL 105 09/19/2018   CO2 28 09/19/2018   GLUCOSE 90 09/19/2018   BUN 20 09/19/2018   CREATININE 0.81 09/19/2018   BILITOT 0.8 09/19/2018   ALKPHOS 75 09/19/2018   AST 21 09/19/2018   ALT 19 09/19/2018   PROT 7.6 09/19/2018   ALBUMIN 4.1 09/19/2018   CALCIUM 9.3 09/19/2018   GFRAA >60 09/19/2018  Speciality Comments: No specialty comments available.  Procedures:  No procedures performed Allergies: Sulfa antibiotics, Clindamycin, Levofloxacin, and Quinolones   Assessment / Plan:     Visit Diagnoses: No diagnosis found.  Orders: No orders of the defined types were placed in this encounter.  No orders of the defined types were placed in this encounter.   Face-to-face time spent with patient was *** minutes. Greater than 50% of time was spent in counseling and coordination of  care.  Follow-Up Instructions: No follow-ups on file.   Earnestine Mealing, CMA  Note - This record has been created using Editor, commissioning.  Chart creation errors have been sought, but may not always  have been located. Such creation errors do not reflect on  the standard of medical care.

## 2021-06-13 ENCOUNTER — Ambulatory Visit: Payer: BC Managed Care – PPO | Admitting: Rheumatology

## 2021-06-13 DIAGNOSIS — G8929 Other chronic pain: Secondary | ICD-10-CM

## 2021-06-13 DIAGNOSIS — Z8709 Personal history of other diseases of the respiratory system: Secondary | ICD-10-CM

## 2021-06-13 DIAGNOSIS — Z8669 Personal history of other diseases of the nervous system and sense organs: Secondary | ICD-10-CM

## 2021-06-13 DIAGNOSIS — Z1589 Genetic susceptibility to other disease: Secondary | ICD-10-CM

## 2021-06-13 DIAGNOSIS — M3500 Sicca syndrome, unspecified: Secondary | ICD-10-CM

## 2021-06-13 DIAGNOSIS — Z8639 Personal history of other endocrine, nutritional and metabolic disease: Secondary | ICD-10-CM

## 2021-06-13 DIAGNOSIS — Z8659 Personal history of other mental and behavioral disorders: Secondary | ICD-10-CM

## 2021-06-13 DIAGNOSIS — M4126 Other idiopathic scoliosis, lumbar region: Secondary | ICD-10-CM

## 2021-06-13 DIAGNOSIS — M5136 Other intervertebral disc degeneration, lumbar region: Secondary | ICD-10-CM

## 2021-08-22 NOTE — Progress Notes (Deleted)
Office Visit Note  Patient: Kara Carey             Date of Birth: [birth year]           MRN: [id number]             PCP: Forrest Moron, MD Referring: Forrest Moron, MD Visit Date: 09/05/2021 Occupation: @GUAROCC @  Subjective:  No chief complaint on file.   History of Present Illness: Kara Carey is a 60 y.o. female ***   Activities of Daily Living:  Patient reports morning stiffness for *** {minute/hour:19697}.   Patient {ACTIONS;DENIES/REPORTS:21021675::"Denies"} nocturnal pain.  Difficulty dressing/grooming: {ACTIONS;DENIES/REPORTS:21021675::"Denies"} Difficulty climbing stairs: {ACTIONS;DENIES/REPORTS:21021675::"Denies"} Difficulty getting out of chair: {ACTIONS;DENIES/REPORTS:21021675::"Denies"} Difficulty using hands for taps, buttons, cutlery, and/or writing: {ACTIONS;DENIES/REPORTS:21021675::"Denies"}  No Rheumatology ROS completed.   PMFS History:  Patient Active Problem List   Diagnosis Date Noted   Cyst of left ovary 09/27/2018   Endometriosis of ovary    DDD (degenerative disc disease), lumbar 09/08/2018   Other idiopathic scoliosis, lumbar region 09/08/2018   History of vitamin D deficiency 08/17/2018   History of asthma 08/17/2018   History of anxiety 08/17/2018   History of uveitis 08/17/2018    Past Medical History:  Diagnosis Date   Allergy    Anemia    Anxiety hx of panic attacks   Asthma    Bilateral leg pain    Family history of adverse reaction to anesthesia    parent's had problem coming out of anesthesia-hard to wake up    Family History  Problem Relation Age of Onset   Heart disease Mother    Dementia Mother    Hyperlipidemia Sister    Asthma Brother    Lymphoma Father    Rheum arthritis Father    Heart disease Father    Diabetes Father    Hypertension Sister    Liver disease Brother    Past Surgical History:  Procedure Laterality Date   LESION EXCISION  07/2002   left upper arm-local anesthesia    ROBOTIC ASSISTED BILATERAL SALPINGO OOPHERECTOMY Left 09/27/2018   Procedure: XI ROBOTIC ASSISTED LEFT SALPINGO OOPHORECTOMY;  Surgeon: Everitt Amber, MD;  Location: [facility];  Service: Gynecology;  Laterality: Left;   Social History   Social History Narrative   Not on file   Immunization History  Administered Date(s) Administered   Influenza-Unspecified 09/11/2019   PFIZER(Purple Top)SARS-COV-2 Vaccination 01/05/2020, 01/26/2020, 09/10/2020     Objective: Vital Signs: There were no vitals taken for this visit.   Physical Exam   Musculoskeletal Exam: ***  CDAI Exam: CDAI Score: -- Patient Global: --; Provider Global: -- Swollen: --; Tender: -- Joint Exam 09/05/2021   No joint exam has been documented for this visit   There is currently no information documented on the homunculus. Go to the Rheumatology activity and complete the homunculus joint exam.  Investigation: No additional findings.  Imaging: No results found.  Recent Labs: Lab Results  Component Value Date   WBC 5.8 09/19/2018   HGB 13.0 09/19/2018   PLT 348 09/19/2018   NA 141 09/19/2018   K 4.4 09/19/2018   CL 105 09/19/2018   CO2 28 09/19/2018   GLUCOSE 90 09/19/2018   BUN 20 09/19/2018   CREATININE 0.81 09/19/2018   BILITOT 0.8 09/19/2018   ALKPHOS 75 09/19/2018   AST 21 09/19/2018   ALT 19 09/19/2018   PROT 7.6 09/19/2018   ALBUMIN 4.1 09/19/2018   CALCIUM 9.3 09/19/2018   GFRAA >60 09/19/2018  Speciality Comments: No specialty comments available.  Procedures:  No procedures performed Allergies: Sulfa antibiotics, Clindamycin, Levofloxacin, and Quinolones   Assessment / Plan:     Visit Diagnoses: Chronic SI joint pain - HLA-B27+, Hx of uveitis (age 48), X-rays of the pelvis were obtained on 08/17/2018 which revealed bilateral SI joint sclerosis and mild narrowing of the left SI  HLA B27 positive  History of uveitis - Patient developed uveitis at age 71. 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  DDD  (degenerative disc disease), lumbar  Other idiopathic scoliosis, lumbar region  History of vitamin D deficiency  History of anxiety  History of asthma  Sicca syndrome ([place])  Orders: No orders of the defined types were placed in this encounter.  No orders of the defined types were placed in this encounter.   Face-to-face time spent with patient was *** minutes. Greater than 50% of time was spent in counseling and coordination of care.  Follow-Up Instructions: No follow-ups on file.   Ofilia Neas, PA-C  Note - This record has been created using Dragon software.  Chart creation errors have been sought, but may not always  have been located. Such creation errors do not reflect on  the standard of medical care.

## 2021-09-05 ENCOUNTER — Ambulatory Visit: Payer: BC Managed Care – PPO | Admitting: Rheumatology

## 2021-09-05 DIAGNOSIS — Z8709 Personal history of other diseases of the respiratory system: Secondary | ICD-10-CM

## 2021-09-05 DIAGNOSIS — Z8659 Personal history of other mental and behavioral disorders: Secondary | ICD-10-CM

## 2021-09-05 DIAGNOSIS — G8929 Other chronic pain: Secondary | ICD-10-CM

## 2021-09-05 DIAGNOSIS — M4126 Other idiopathic scoliosis, lumbar region: Secondary | ICD-10-CM

## 2021-09-05 DIAGNOSIS — Z1589 Genetic susceptibility to other disease: Secondary | ICD-10-CM

## 2021-09-05 DIAGNOSIS — M3500 Sicca syndrome, unspecified: Secondary | ICD-10-CM

## 2021-09-05 DIAGNOSIS — M5136 Other intervertebral disc degeneration, lumbar region: Secondary | ICD-10-CM

## 2021-09-05 DIAGNOSIS — Z8669 Personal history of other diseases of the nervous system and sense organs: Secondary | ICD-10-CM

## 2021-09-05 DIAGNOSIS — Z8639 Personal history of other endocrine, nutritional and metabolic disease: Secondary | ICD-10-CM

## 2021-09-26 NOTE — Progress Notes (Signed)
Office Visit Note  Patient: Kara Carey             Date of Birth: [birth year]           MRN: [id number]             PCP: Forrest Moron, MD Referring: Forrest Moron, MD Visit Date: 10/09/2021 Occupation: @GUAROCC @  Subjective:  Lower back pain.   History of Present Illness: Kara Carey is a 60 y.o. female with history of degenerative disc disease and chronic SI joint pain.  She states she continues to have some lower back pain and SI joint discomfort especially on the left right side.  She denies any radiculopathy.  She states that her sicca symptoms are better and she did not have to use any over-the-counter products.  She has had no recurrence of uveitis.  There is no history of Achilles tendinitis or plantar fasciitis.  There is no history of inflammatory arthritis.  Patient states that she developed COVID-19 infection in October.  She has been experiencing fatigue since then.  She denies any shortness of breath or palpitations.   Activities of Daily Living:  Patient reports morning stiffness for several  hours.   Patient Denies nocturnal pain.  Difficulty dressing/grooming: Denies Difficulty climbing stairs: Denies Difficulty getting out of chair: Denies Difficulty using hands for taps, buttons, cutlery, and/or writing: Denies  Review of Systems  Constitutional:  Positive for fatigue.  HENT:  Negative for mouth sores, mouth dryness and nose dryness.   Eyes:  Negative for pain, itching and dryness.  Respiratory:  Negative for shortness of breath and difficulty breathing.   Cardiovascular:  Negative for chest pain and palpitations.  Gastrointestinal:  Negative for blood in stool, constipation and diarrhea.  Endocrine: Negative for increased urination.  Genitourinary:  Negative for difficulty urinating.  Musculoskeletal:  Positive for myalgias, morning stiffness, muscle tenderness and myalgias. Negative for joint pain, joint pain and joint swelling.  Skin:   Negative for color change, rash and redness.  Allergic/Immunologic: Negative for susceptible to infections.  Neurological:  Negative for dizziness, numbness, headaches, memory loss and weakness.  Hematological:  Negative for bruising/bleeding tendency.  Psychiatric/Behavioral:  Negative for confusion.    PMFS History:  Patient Active Problem List   Diagnosis Date Noted   Cyst of left ovary 09/27/2018   Endometriosis of ovary    DDD (degenerative disc disease), lumbar 09/08/2018   Other idiopathic scoliosis, lumbar region 09/08/2018   History of vitamin D deficiency 08/17/2018   History of asthma 08/17/2018   History of anxiety 08/17/2018   History of uveitis 08/17/2018    Past Medical History:  Diagnosis Date   Allergy    Anemia    Anxiety hx of panic attacks   Asthma    Bilateral leg pain    Family history of adverse reaction to anesthesia    parent's had problem coming out of anesthesia-hard to wake up    Family History  Problem Relation Age of Onset   Heart disease Mother    Dementia Mother    Hyperlipidemia Sister    Asthma Brother    Lymphoma Father    Rheum arthritis Father    Heart disease Father    Diabetes Father    Hypertension Sister    Liver disease Brother    Past Surgical History:  Procedure Laterality Date   LESION EXCISION  07/2002   left upper arm-local anesthesia   ROBOTIC ASSISTED BILATERAL SALPINGO OOPHERECTOMY Left  09/27/2018   Procedure: XI ROBOTIC ASSISTED LEFT SALPINGO OOPHORECTOMY;  Surgeon: Everitt Amber, MD;  Location: [facility];  Service: Gynecology;  Laterality: Left;   Social History   Social History Narrative   Not on file   Immunization History  Administered Date(s) Administered   Influenza-Unspecified 09/11/2019   PFIZER(Purple Top)SARS-COV-2 Vaccination 01/05/2020, 01/26/2020, 09/10/2020     Objective: Vital Signs: BP (!) 148/84 (BP Location: Left Arm, Patient Position: Sitting, Cuff Size: Normal)   Pulse 70   Ht 5' 3.5"  (1.613 m)   Wt 196 lb 3.2 oz (89 kg)   BMI 34.21 kg/m    Physical Exam Vitals and nursing note reviewed.  Constitutional:      Appearance: She is well-developed.  HENT:     Head: Normocephalic and atraumatic.  Eyes:     Conjunctiva/sclera: Conjunctivae normal.  Cardiovascular:     Rate and Rhythm: Normal rate and regular rhythm.     Heart sounds: Normal heart sounds.  Pulmonary:     Effort: Pulmonary effort is normal.     Breath sounds: Normal breath sounds.  Abdominal:     General: Bowel sounds are normal.     Palpations: Abdomen is soft.  Musculoskeletal:     Cervical back: Normal range of motion.  Lymphadenopathy:     Cervical: No cervical adenopathy.  Skin:    General: Skin is warm and dry.     Capillary Refill: Capillary refill takes less than 2 seconds.  Neurological:     Mental Status: She is alert and oriented to person, place, and time.  Psychiatric:        Behavior: Behavior normal.     Musculoskeletal Exam: C-spine was in good range of motion.  Lumbar spine was in good range of motion with some discomfort.  She had tenderness over right SI joint.  Shoulder joints, elbow joints, wrist joints, MCPs PIPs and DIPs with good range of motion with no synovitis.  Hip joints, knee joints, ankles, MTPs and PIPs with good range of motion with no synovitis.  There was no evidence of Achilles tendinitis or plantar fasciitis.  CDAI Exam: CDAI Score: -- Patient Global: --; Provider Global: -- Swollen: --; Tender: -- Joint Exam 10/09/2021   No joint exam has been documented for this visit   There is currently no information documented on the homunculus. Go to the Rheumatology activity and complete the homunculus joint exam.  Investigation: No additional findings.  Imaging: No results found.  Recent Labs: Lab Results  Component Value Date   WBC 5.8 09/19/2018   HGB 13.0 09/19/2018   PLT 348 09/19/2018   NA 141 09/19/2018   K 4.4 09/19/2018   CL 105 09/19/2018    CO2 28 09/19/2018   GLUCOSE 90 09/19/2018   BUN 20 09/19/2018   CREATININE 0.81 09/19/2018   BILITOT 0.8 09/19/2018   ALKPHOS 75 09/19/2018   AST 21 09/19/2018   ALT 19 09/19/2018   PROT 7.6 09/19/2018   ALBUMIN 4.1 09/19/2018   CALCIUM 9.3 09/19/2018   GFRAA >60 09/19/2018    Speciality Comments: No specialty comments available.  Procedures:  No procedures performed Allergies: Sulfa antibiotics, Clindamycin, Levofloxacin, and Quinolones   Assessment / Plan:     Visit Diagnoses: Chronic SI joint pain - HLA-B27+, Hx of uveitis (age 6), X-rays of the pelvis were obtained on 08/17/2018 which revealed bilateral SI joint sclerosis and mild narrowing of the left SI.  She continues to have some discomfort over the right  SI joint.  She denies any morning stiffness.  Stretching exercises were demonstrated and discussed in the office today.  She has been taking natural anti-inflammatories which has been helpful.  HLA B27 positive  DDD (degenerative disc disease), lumbar - X-rays of the lumbar spine were obtained on 08/17/2018: findings are consistent with levoscoliosis, spondylosis and facet joint arthropathy.  She continues to have lower back pain.  She states she has not been exercising on a regular basis.  Stretching exercises were demonstrated in the office today.  Other idiopathic scoliosis, lumbar region  History of uveitis - Patient developed uveitis at age 27. 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  She denies any recurrence of uveitis.  She sees her ophthalmologist on a regular basis.  She denies having any uveitis flares.  Sicca syndrome ([facility]) -her sicca symptoms have improved.  She states her sicca symptoms are more seasonal.  She is currently not using any over-the-counter products.  History of vitamin D deficiency-she has been taking vitamin D 1000 units daily.  Osteoporosis screening-she was advised to get DEXA scan through her PCP.  History of asthma  History of anxiety  COVID-19 virus  infection-patient states she developed COVID-19 infection in October 2022.  She has been experiencing increased fatigue since then.  Orders: No orders of the defined types were placed in this encounter.  No orders of the defined types were placed in this encounter.  Face-to-face time spent patient was 30 minutes.  50% time was spent in counseling and coordination of care.  Follow-Up Instructions: Return in about 1 year (around 10/09/2022) for DDD, HLA B -27 +.   Bo Merino, MD  Note - This record has been created using Editor, commissioning.  Chart creation errors have been sought, but may not always  have been located. Such creation errors do not reflect on  the standard of medical care.

## 2021-10-09 ENCOUNTER — Encounter: Payer: Self-pay | Admitting: Rheumatology

## 2021-10-09 ENCOUNTER — Other Ambulatory Visit: Payer: Self-pay

## 2021-10-09 ENCOUNTER — Ambulatory Visit: Payer: 59 | Admitting: Rheumatology

## 2021-10-09 VITALS — BP 148/84 | HR 70 | Ht 63.5 in | Wt 196.2 lb

## 2021-10-09 DIAGNOSIS — Z8709 Personal history of other diseases of the respiratory system: Secondary | ICD-10-CM

## 2021-10-09 DIAGNOSIS — Z8659 Personal history of other mental and behavioral disorders: Secondary | ICD-10-CM

## 2021-10-09 DIAGNOSIS — M533 Sacrococcygeal disorders, not elsewhere classified: Secondary | ICD-10-CM | POA: Diagnosis not present

## 2021-10-09 DIAGNOSIS — Z8669 Personal history of other diseases of the nervous system and sense organs: Secondary | ICD-10-CM

## 2021-10-09 DIAGNOSIS — Z13820 Encounter for screening for osteoporosis: Secondary | ICD-10-CM

## 2021-10-09 DIAGNOSIS — M4126 Other idiopathic scoliosis, lumbar region: Secondary | ICD-10-CM

## 2021-10-09 DIAGNOSIS — M3500 Sicca syndrome, unspecified: Secondary | ICD-10-CM

## 2021-10-09 DIAGNOSIS — U071 COVID-19: Secondary | ICD-10-CM

## 2021-10-09 DIAGNOSIS — M5136 Other intervertebral disc degeneration, lumbar region: Secondary | ICD-10-CM

## 2021-10-09 DIAGNOSIS — Z1589 Genetic susceptibility to other disease: Secondary | ICD-10-CM | POA: Diagnosis not present

## 2021-10-09 DIAGNOSIS — G8929 Other chronic pain: Secondary | ICD-10-CM

## 2021-10-09 DIAGNOSIS — Z8639 Personal history of other endocrine, nutritional and metabolic disease: Secondary | ICD-10-CM

## 2021-10-09 NOTE — Patient Instructions (Signed)
Back Exercises The following exercises strengthen the muscles that help to support the trunk (torso) and back. They also help to keep the lower back flexible. Doing these exercises can help to prevent or lessen existing low back pain. If you have back pain or discomfort, try doing these exercises 2-3 times each day or as told by your health care provider. As your pain improves, do them once each day, but increase the number of times that you repeat the steps for each exercise (do more repetitions). To prevent the recurrence of back pain, continue to do these exercises once each day or as told by your health care provider. Do exercises exactly as told by your health care provider and adjust them as directed. It is normal to feel mild stretching, pulling, tightness, or discomfort as you do these exercises, but you should stop right away if you feel sudden pain or your pain gets worse. Exercises Single knee to chest Repeat these steps 3-5 times for each leg: Lie on your back on a firm bed or the floor with your legs extended. Bring one knee to your chest. Your other leg should stay extended and in contact with the floor. Hold your knee in place by grabbing your knee or thigh with both hands and hold. Pull on your knee until you feel a gentle stretch in your lower back or buttocks. Hold the stretch for 10-30 seconds. Slowly release and straighten your leg.  Pelvic tilt Repeat these steps 5-10 times: Lie on your back on a firm bed or the floor with your legs extended. Bend your knees so they are pointing toward the ceiling and your feet are flat on the floor. Tighten your lower abdominal muscles to press your lower back against the floor. This motion will tilt your pelvis so your tailbone points up toward the ceiling instead of pointing to your feet or the floor. With gentle tension and even breathing, hold this position for 5-10 seconds.  Cat-cow Repeat these steps until your lower back becomes  more flexible: Get into a hands-and-knees position on a firm bed or the floor. Keep your hands under your shoulders, and keep your knees under your hips. You may place padding under your knees for comfort. Let your head hang down toward your chest. Contract your abdominal muscles and point your tailbone toward the floor so your lower back becomes rounded like the back of a cat. Hold this position for 5 seconds. Slowly lift your head, let your abdominal muscles relax, and point your tailbone up toward the ceiling so your back forms a sagging arch like the back of a cow. Hold this position for 5 seconds.  Press-ups Repeat these steps 5-10 times: Lie on your abdomen (face-down) on a firm bed or the floor. Place your palms near your head, about shoulder-width apart. Keeping your back as relaxed as possible and keeping your hips on the floor, slowly straighten your arms to raise the top half of your body and lift your shoulders. Do not use your back muscles to raise your upper torso. You may adjust the placement of your hands to make yourself more comfortable. Hold this position for 5 seconds while you keep your back relaxed. Slowly return to lying flat on the floor.  Bridges Repeat these steps 10 times: Lie on your back on a firm bed or the floor. Bend your knees so they are pointing toward the ceiling and your feet are flat on the floor. Your arms should be flat  at your sides, next to your body. Tighten your buttocks muscles and lift your buttocks off the floor until your waist is at almost the same height as your knees. You should feel the muscles working in your buttocks and the back of your thighs. If you do not feel these muscles, slide your feet 1-2 inches (2.5-5 cm) farther away from your buttocks. Hold this position for 3-5 seconds. Slowly lower your hips to the starting position, and allow your buttocks muscles to relax completely. If this exercise is too easy, try doing it with your arms  crossed over your chest. Abdominal crunches Repeat these steps 5-10 times: Lie on your back on a firm bed or the floor with your legs extended. Bend your knees so they are pointing toward the ceiling and your feet are flat on the floor. Cross your arms over your chest. Tip your chin slightly toward your chest without bending your neck. Tighten your abdominal muscles and slowly raise your torso high enough to lift your shoulder blades a tiny bit off the floor. Avoid raising your torso higher than that because it can put too much stress on your lower back and does not help to strengthen your abdominal muscles. Slowly return to your starting position.  Back lifts Repeat these steps 5-10 times: Lie on your abdomen (face-down) with your arms at your sides, and rest your forehead on the floor. Tighten the muscles in your legs and your buttocks. Slowly lift your chest off the floor while you keep your hips pressed to the floor. Keep the back of your head in line with the curve in your back. Your eyes should be looking at the floor. Hold this position for 3-5 seconds. Slowly return to your starting position.  Contact a health care provider if: Your back pain or discomfort gets much worse when you do an exercise. Your worsening back pain or discomfort does not lessen within 2 hours after you exercise. If you have any of these problems, stop doing these exercises right away. Do not do them again unless your health care provider says that you can. Get help right away if: You develop sudden, severe back pain. If this happens, stop doing the exercises right away. Do not do them again unless your health care provider says that you can. This information is not intended to replace advice given to you by your health care provider. Make sure you discuss any questions you have with your health care provider. Document Revised: 04/01/2021 Document Reviewed: 12/18/2020 Elsevier Patient Education  [place] [facility] Ask your health care provider which exercises are safe for you. Do exercises exactly as told by your health care provider and adjust them as directed. It is normal to feel mild stretching, pulling, tightness, or discomfort as you do these exercises. Stop right away if you feel sudden pain or your pain gets significantly worse. Do not begin these exercises until told by your health care provider. Stretching and range-of-motion exercises These exercises warm up your muscles and joints and improve the movement and flexibility of your hip and pelvis. Quadriceps stretch, prone  Lie on your abdomen (prone position) on a firm surface, such as a bed or padded floor. Bend your left / right knee and reach back to hold your ankle or pant leg. If you cannot reach your ankle or pant leg, loop a belt around your foot and grab the belt instead. Gently pull your heel toward your buttocks. Your  knee should not slide out to the side. You should feel a stretch in the front of your thigh and knee (quadriceps). Hold this position for __________ seconds. Repeat __________ times. Complete this exercise __________ times a day. Iliotibial band stretch An iliotibial band is a strong band of muscle tissue that runs from the outer side of your hip to the outer side of your thigh and knee. Lie on your side with your left / right leg in the top position. Bend both of your knees and grab your left / right ankle. Stretch out your bottom arm to help you balance. Slowly bring your top knee back so your thigh goes behind your trunk. Slowly lower your top leg toward the floor until you feel a gentle stretch on the outside of your left / right hip and thigh. If you do not feel a stretch and your knee will not fall farther, place the heel of your other foot on top of your knee and pull your knee down toward the floor with your foot. Hold this position for __________ seconds. Repeat __________  times. Complete this exercise __________ times a day. Strengthening exercises These exercises build strength and endurance in your hip and pelvis. Endurance is the ability to use your muscles for a long time, even after they get tired. Straight leg raises, side-lying This exercise strengthens the muscles that rotate the leg at the hip and move it away from your body (hip abductors). Lie on your side with your left / right leg in the top position. Lie so your head, shoulder, hip, and knee line up. You may bend your bottom knee to help you balance. Roll your hips slightly forward so your hips are stacked directly over each other and your left / right knee is facing forward. Tense the muscles in your outer thigh and lift your top leg 4-6 inches (10-15 cm). Hold this position for __________ seconds. Slowly lower your leg to return to the starting position. Let your muscles relax completely before doing another repetition. Repeat __________ times. Complete this exercise __________ times a day. Leg raises, prone This exercise strengthens the muscles that move the hips backward (hip extensors). Lie on your abdomen (prone position) on your bed or a firm surface. You can put a pillow under your hips if that is more comfortable for your lower back. Bend your left / right knee so your foot is straight up in the air. Squeeze your buttocks muscles and lift your left / right thigh off the bed. Do not let your back arch. Tense your thigh muscle as hard as you can without increasing any knee pain. Hold this position for __________ seconds. Slowly lower your leg to return to the starting position and allow it to relax completely. Repeat __________ times. Complete this exercise __________ times a day. Hip hike Stand sideways on a bottom step. Stand on your left / right leg with your other foot unsupported next to the step. You can hold on to a railing or wall for balance if needed. Keep your knees straight and  your torso square. Then lift your left / right hip up toward the ceiling. Slowly let your left / right hip lower toward the floor, past the starting position. Your foot should get closer to the floor. Do not lean or bend your knees. Repeat __________ times. Complete this exercise __________ times a day. This information is not intended to replace advice given to you by your health care provider. Make sure  you discuss any questions you have with your health care provider. Document Revised: 12/13/2019 Document Reviewed: 12/13/2019 Elsevier Patient Education  [place].

## 2022-05-21 NOTE — Progress Notes (Unsigned)
Office Visit Note  Patient: Kara Carey             Date of Birth: [birth year]           MRN: [id number]             PCP: Forrest Moron, MD Referring: Forrest Moron, MD Visit Date: 05/27/2022 Occupation: @GUAROCC @  Subjective:  Myalgias   History of Present Illness: Kara Carey is a 61 y.o. female with history of chronic SI joint pain, history of uveitis, and DDD.  Patient reports that several times throughout the years she experiences episodic body aches which are typically self resolving.  She states her most recent episode lasted from Tuesday through Sunday which time she was having generalized myalgias.  She denies any muscle weakness during that time.  She states she was also experiencing increased fatigue.  She was unable to identify a trigger for her symptoms but states that she had been in [place] for 3 weeks prior to the episode.  She was more active than usual and also her sleeping regimen was off while traveling.  She denies any SI joint pain during this episode.  She denies any joint swelling.  She took tylenol at bedtime for pain relief.  She has started back walking on the treadmill daily for exercise.  She states her sicca symptoms have been stable.  She denies any uveitis flares.     Activities of Daily Living:  Patient reports morning stiffness for 0  none .   Patient Reports nocturnal pain.  Difficulty dressing/grooming: Denies Difficulty climbing stairs: Denies Difficulty getting out of chair: Denies Difficulty using hands for taps, buttons, cutlery, and/or writing: Denies  Review of Systems  Constitutional:  Positive for fatigue.  HENT:  Positive for mouth dryness. Negative for mouth sores.   Eyes:  Positive for dryness.  Respiratory:  Negative for shortness of breath.   Cardiovascular:  Negative for chest pain and palpitations.  Gastrointestinal:  Negative for blood in stool, constipation and diarrhea.  Endocrine: Negative for increased  urination.  Genitourinary:  Negative for involuntary urination.  Musculoskeletal:  Negative for joint pain, joint pain, joint swelling, myalgias, muscle weakness, morning stiffness, muscle tenderness and myalgias.  Skin:  Positive for hair loss. Negative for color change, rash and sensitivity to sunlight.  Allergic/Immunologic: Negative for susceptible to infections.  Neurological:  Negative for dizziness and headaches.  Hematological:  Negative for swollen glands.  Psychiatric/Behavioral:  Positive for sleep disturbance. Negative for depressed mood. The patient is nervous/anxious.     PMFS History:  Patient Active Problem List   Diagnosis Date Noted   Cyst of left ovary 09/27/2018   Endometriosis of ovary    DDD (degenerative disc disease), lumbar 09/08/2018   Other idiopathic scoliosis, lumbar region 09/08/2018   History of vitamin D deficiency 08/17/2018   History of asthma 08/17/2018   History of anxiety 08/17/2018   History of uveitis 08/17/2018    Past Medical History:  Diagnosis Date   Allergy    Anemia    Anxiety hx of panic attacks   Asthma    Bilateral leg pain    Family history of adverse reaction to anesthesia    parent's had problem coming out of anesthesia-hard to wake up    Family History  Problem Relation Age of Onset   Heart disease Mother    Dementia Mother    Hyperlipidemia Sister    Asthma Brother    Lymphoma Father  Rheum arthritis Father    Heart disease Father    Diabetes Father    Hypertension Sister    Liver disease Brother    Past Surgical History:  Procedure Laterality Date   LESION EXCISION  07/2002   left upper arm-local anesthesia   ROBOTIC ASSISTED BILATERAL SALPINGO OOPHERECTOMY Left 09/27/2018   Procedure: XI ROBOTIC ASSISTED LEFT SALPINGO OOPHORECTOMY;  Surgeon: Everitt Amber, MD;  Location: [facility];  Service: Gynecology;  Laterality: Left;   Social History   Social History Narrative   Not on file   Immunization History   Administered Date(s) Administered   Influenza-Unspecified 09/11/2019   PFIZER(Purple Top)SARS-COV-2 Vaccination 01/05/2020, 01/26/2020, 09/10/2020     Objective: Vital Signs: BP (!) 152/90 (BP Location: Left Arm, Patient Position: Sitting, Cuff Size: Normal)   Pulse 76   Resp 15   Ht 5' 3"  (1.6 m)   Wt 186 lb 9.6 oz (84.6 kg)   BMI 33.05 kg/m    Physical Exam Vitals and nursing note reviewed.  Constitutional:      Appearance: She is well-developed.  HENT:     Head: Normocephalic and atraumatic.  Eyes:     Conjunctiva/sclera: Conjunctivae normal.  Cardiovascular:     Rate and Rhythm: Normal rate and regular rhythm.     Heart sounds: Normal heart sounds.  Pulmonary:     Effort: Pulmonary effort is normal.     Breath sounds: Normal breath sounds.  Abdominal:     General: Bowel sounds are normal.     Palpations: Abdomen is soft.  Musculoskeletal:     Cervical back: Normal range of motion.  Skin:    General: Skin is warm and dry.     Capillary Refill: Capillary refill takes less than 2 seconds.  Neurological:     Mental Status: She is alert and oriented to person, place, and time.  Psychiatric:        Behavior: Behavior normal.      Musculoskeletal Exam: C-spine, thoracic spine, and lumbar spine good ROM.  Tenderness over both SI joints upon palpation.  No midline spinal tenderness.  Shoulder joints, elbow joints, wrist joints, MCPs, PIPs, and DIPs good ROM with no synovitis.  Complete fist formation.  Hip joints, knee joints, and ankle joints have good ROM.  No warmth or effusion of knee joints.  No tenderness or swelling of ankle joints.   CDAI Exam: CDAI Score: -- Patient Global: --; Provider Global: -- Swollen: --; Tender: -- Joint Exam 05/27/2022   No joint exam has been documented for this visit   There is currently no information documented on the homunculus. Go to the Rheumatology activity and complete the homunculus joint exam.  Investigation: No  additional findings.  Imaging: No results found.  Recent Labs: Lab Results  Component Value Date   WBC 5.8 09/19/2018   HGB 13.0 09/19/2018   PLT 348 09/19/2018   NA 141 09/19/2018   K 4.4 09/19/2018   CL 105 09/19/2018   CO2 28 09/19/2018   GLUCOSE 90 09/19/2018   BUN 20 09/19/2018   CREATININE 0.81 09/19/2018   BILITOT 0.8 09/19/2018   ALKPHOS 75 09/19/2018   AST 21 09/19/2018   ALT 19 09/19/2018   PROT 7.6 09/19/2018   ALBUMIN 4.1 09/19/2018   CALCIUM 9.3 09/19/2018   GFRAA >60 09/19/2018    Speciality Comments: No specialty comments available.  Procedures:  No procedures performed Allergies: Sulfa antibiotics, Clindamycin, Levofloxacin, and Quinolones   Assessment / Plan:  Visit Diagnoses: Chronic SI joint pain - HLA-B27+, Hx of uveitis (age 46), X-rays of the pelvis were obtained on 08/17/2018 which revealed bilateral SI joint sclerosis and mild narrowing of the left SI: No signs or symptoms of spondyloarthropathy at this time.  She no synovitis or dactylitis on examination today.  No evidence of Achilles tendinitis or plantar fasciitis.  She has occasional SI joint discomfort and has some tenderness over both SI joints upon palpation today.  Overall her symptoms have been tolerable.  She has not been experiencing any morning stiffness or nocturnal pain.  She remains active walking on the treadmill on a daily basis.  She was advised to notify us if her symptoms persist or worsen.  She will follow up in 6 months or sooner if needed.   HLA B27 positive  DDD (degenerative disc disease), lumbar - X-rays of the lumbar spine were obtained on 08/17/2018: findings are consistent with levoscoliosis, spondylosis and facet joint arthropathy.  No midline spinal tenderness at this time.   Other idiopathic scoliosis, lumbar region: No midline spinal tenderness at this time.   History of uveitis - Patient developed uveitis at age 35. 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  No recurrence.  No  conjunctival injection noted.   Sicca syndrome ([place]): Stable.  Discussed the use of over-the-counter products.  Myalgia: Patient presents today to discuss a recent episode of generalized myalgias lasting from Tuesday to Sunday after a 3-week vacation in [place].  She experiences these episodes of fatigue and myalgias a couple of times throughout the year which are typically self resolving.  She has been unable to identify a trigger for the symptoms.  During these flares she experiences body aches similar to when she has had the flu in the past.  She remains afebrile during the flares but has fatigue.  She has not had any muscular weakness during these episodes.  She took Tylenol at bedtime which was helpful.  Her symptoms were self resolving. Her symptoms raise the concern for myofascial pain or fibromyalgia.  She has a family history of fibromyalgia (mother).  Discussed that fibromyalgia is a diagnosis of exclusion. On examination today she has trapezius muscle tension and tenderness.  No muscle spasms currently.  No muscular weakness was noted.  She has full strength of upper and lower extremities on examination today.  Discussed the diagnosis of myofascial pain versus fibromyalgia.  I also discussed the importance of stress management, regular exercise, and good sleep hygiene.  She was advised to notify us if these episodes become more frequent or more severe.  Other medical conditions are listed as follows:   History of vitamin D deficiency: She is taking vitamin D 1000 units daily.   History of anxiety  History of asthma  COVID-19 virus infection - October 2022.      Orders: No orders of the defined types were placed in this encounter.  No orders of the defined types were placed in this encounter.   Follow-Up Instructions: Return in about 6 months (around 11/27/2022) for Chronic SI joint pain, Hx of uveitis, DDD.   Ofilia Neas, PA-C  Note - This record has been created using  Dragon software.  Chart creation errors have been sought, but may not always  have been located. Such creation errors do not reflect on  the standard of medical care.

## 2022-05-27 ENCOUNTER — Encounter: Payer: Self-pay | Admitting: Physician Assistant

## 2022-05-27 ENCOUNTER — Ambulatory Visit: Payer: Managed Care, Other (non HMO) | Attending: Physician Assistant | Admitting: Physician Assistant

## 2022-05-27 VITALS — BP 152/90 | HR 76 | Resp 15 | Ht 63.0 in | Wt 186.6 lb

## 2022-05-27 DIAGNOSIS — M533 Sacrococcygeal disorders, not elsewhere classified: Secondary | ICD-10-CM | POA: Diagnosis not present

## 2022-05-27 DIAGNOSIS — Z8659 Personal history of other mental and behavioral disorders: Secondary | ICD-10-CM

## 2022-05-27 DIAGNOSIS — Z8709 Personal history of other diseases of the respiratory system: Secondary | ICD-10-CM

## 2022-05-27 DIAGNOSIS — M3500 Sicca syndrome, unspecified: Secondary | ICD-10-CM

## 2022-05-27 DIAGNOSIS — Z1589 Genetic susceptibility to other disease: Secondary | ICD-10-CM | POA: Diagnosis not present

## 2022-05-27 DIAGNOSIS — M5136 Other intervertebral disc degeneration, lumbar region: Secondary | ICD-10-CM | POA: Diagnosis not present

## 2022-05-27 DIAGNOSIS — Z8669 Personal history of other diseases of the nervous system and sense organs: Secondary | ICD-10-CM

## 2022-05-27 DIAGNOSIS — Z8639 Personal history of other endocrine, nutritional and metabolic disease: Secondary | ICD-10-CM

## 2022-05-27 DIAGNOSIS — M4126 Other idiopathic scoliosis, lumbar region: Secondary | ICD-10-CM | POA: Diagnosis not present

## 2022-05-27 DIAGNOSIS — U071 COVID-19: Secondary | ICD-10-CM

## 2022-05-27 DIAGNOSIS — M791 Myalgia, unspecified site: Secondary | ICD-10-CM

## 2022-05-27 DIAGNOSIS — Z13820 Encounter for screening for osteoporosis: Secondary | ICD-10-CM

## 2022-05-27 DIAGNOSIS — G8929 Other chronic pain: Secondary | ICD-10-CM

## 2022-09-14 NOTE — Progress Notes (Signed)
Office Visit Note  Patient: Kara Carey             Date of Birth: 22-Jul-1961           MRN: [id number]             PCP: Forrest Moron, MD Referring: Forrest Moron, MD Visit Date: 09/25/2022 Occupation: _0 @  Subjective:  Dry mouth and joint pain  History of Present Illness: Kara Carey is a 61 y.o. female with history of osteoarthritis, degenerative disc disease and sicca symptoms.  She states she continues to have dry mouth symptoms especially at nighttime.  Symptoms are worse with weather change.  She has intermittent discomfort in her SI joints.  She denies any discomfort today.  She continues to have lower back pain off-and-on.  She states that she has been doing virtual physical therapy which has been helpful.  The myalgias she was experiencing have resolved.  Activities of Daily Living:  Patient reports morning stiffness for 0 minutes.   Patient Denies nocturnal pain.  Difficulty dressing/grooming: Denies Difficulty climbing stairs: Denies Difficulty getting out of chair: Denies Difficulty using hands for taps, buttons, cutlery, and/or writing: Denies  Review of Systems  Constitutional:  Positive for fatigue.  HENT:  Positive for mouth dryness. Negative for mouth sores.   Eyes:  Positive for dryness.  Respiratory:  Negative for shortness of breath.   Cardiovascular:  Negative for chest pain and palpitations.  Gastrointestinal:  Negative for blood in stool, constipation and diarrhea.  Endocrine: Negative for increased urination.  Genitourinary:  Negative for involuntary urination.  Musculoskeletal:  Positive for myalgias, muscle tenderness and myalgias. Negative for joint pain, gait problem, joint pain, joint swelling, muscle weakness and morning stiffness.  Skin:  Negative for color change, rash, hair loss and sensitivity to sunlight.  Allergic/Immunologic: Negative for susceptible to infections.  Neurological:  Negative for dizziness and  headaches.  Hematological:  Negative for swollen glands.  Psychiatric/Behavioral:  Negative for depressed mood and sleep disturbance. The patient is nervous/anxious.     PMFS History:  Patient Active Problem List   Diagnosis Date Noted   Cyst of left ovary 09/27/2018   Endometriosis of ovary    DDD (degenerative disc disease), lumbar 09/08/2018   Other idiopathic scoliosis, lumbar region 09/08/2018   History of vitamin D deficiency 08/17/2018   History of asthma 08/17/2018   History of anxiety 08/17/2018   History of uveitis 08/17/2018    Past Medical History:  Diagnosis Date   Allergy    Anemia    Anxiety hx of panic attacks   Asthma    Bilateral leg pain    Family history of adverse reaction to anesthesia    parent's had problem coming out of anesthesia-hard to wake up    Family History  Problem Relation Age of Onset   Heart disease Mother    Dementia Mother    Hyperlipidemia Sister    Asthma Brother    Lymphoma Father    Rheum arthritis Father    Heart disease Father    Diabetes Father    Hypertension Sister    Liver disease Brother    Past Surgical History:  Procedure Laterality Date   LESION EXCISION  07/2002   left upper arm-local anesthesia   ROBOTIC ASSISTED BILATERAL SALPINGO OOPHERECTOMY Left 09/27/2018   Procedure: XI ROBOTIC ASSISTED LEFT SALPINGO OOPHORECTOMY;  Surgeon: Everitt Amber, MD;  Location: [facility];  Service: Gynecology;  Laterality: Left;   Social  History   Social History Narrative   Not on file   Immunization History  Administered Date(s) Administered   Influenza-Unspecified 09/11/2019   PFIZER(Purple Top)SARS-COV-2 Vaccination 01/05/2020, 01/26/2020, 09/10/2020     Objective: Vital Signs: BP 135/85 (BP Location: Left Arm, Patient Position: Sitting, Cuff Size: Normal)   Pulse 89   Ht 5' 3.5" (1.613 m)   Wt 187 lb (84.8 kg)   BMI 32.61 kg/m    Physical Exam Vitals and nursing note reviewed.  Constitutional:      Appearance: She  is well-developed.  HENT:     Head: Normocephalic and atraumatic.  Eyes:     Conjunctiva/sclera: Conjunctivae normal.  Cardiovascular:     Rate and Rhythm: Normal rate and regular rhythm.     Heart sounds: Normal heart sounds.  Pulmonary:     Effort: Pulmonary effort is normal.     Breath sounds: Normal breath sounds.  Abdominal:     General: Bowel sounds are normal.     Palpations: Abdomen is soft.  Musculoskeletal:     Cervical back: Normal range of motion.  Lymphadenopathy:     Cervical: No cervical adenopathy.  Skin:    General: Skin is warm and dry.     Capillary Refill: Capillary refill takes less than 2 seconds.  Neurological:     Mental Status: She is alert and oriented to person, place, and time.  Psychiatric:        Behavior: Behavior normal.      Musculoskeletal Exam: Cervical, thoracic and lumbar spine were in good range of motion.  She had no SI joint tenderness.  Shoulder joints, elbow joints, wrist joints, MCPs PIPs and DIPs been good range of motion with no synovitis.  Hip joints, knee joints, ankles, MTPs and PIPs been good range of motion with no synovitis.  CDAI Exam: CDAI Score: -- Patient Global: --; Provider Global: -- Swollen: --; Tender: -- Joint Exam 09/25/2022   No joint exam has been documented for this visit   There is currently no information documented on the homunculus. Go to the Rheumatology activity and complete the homunculus joint exam.  Investigation: No additional findings.  Imaging: No results found.  Recent Labs: Lab Results  Component Value Date   WBC 5.8 09/19/2018   HGB 13.0 09/19/2018   PLT 348 09/19/2018   NA 141 09/19/2018   K 4.4 09/19/2018   CL 105 09/19/2018   CO2 28 09/19/2018   GLUCOSE 90 09/19/2018   BUN 20 09/19/2018   CREATININE 0.81 09/19/2018   BILITOT 0.8 09/19/2018   ALKPHOS 75 09/19/2018   AST 21 09/19/2018   ALT 19 09/19/2018   PROT 7.6 09/19/2018   ALBUMIN 4.1 09/19/2018   CALCIUM 9.3  09/19/2018   GFRAA >60 09/19/2018    Speciality Comments: No specialty comments available.  Procedures:  No procedures performed Allergies: Sulfa antibiotics, Clindamycin, Levofloxacin, and Quinolones   Assessment / Plan:     Visit Diagnoses: Chronic SI joint pain -she denies any SI joint pain today.  She has intermittent discomfort in the SI joints.  HLA-B27+, Hx of uveitis (age 32), X-rays of the pelvis were obtained on 08/17/2018 which revealed bilateral SI joint sclerosis and mild narrowing of the left SI  HLA B27 positive  DDD (degenerative disc disease), lumbar -she has intermittent discomfort in the lumbar spine.  She had good mobility in her lumbar spine without discomfort today.  As she has been doing core strengthening exercises virtually.  X-rays of the  lumbar spine were obtained on 08/17/2018: findings are consistent with levoscoliosis, spondylosis and facet joint arthropathy.  Other idiopathic scoliosis, lumbar region  History of uveitis -she denies recurrence of uveitis.  Patient developed uveitis at age 89. 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    Sicca syndrome (HCC)-she continues to have dry mouth symptoms.  Over-the-counter products were discussed at length.  History of anxiety  History of vitamin D deficiency-she has been taking vitamin D 1000 units daily.  History of asthma  Osteoporosis screening-she is postmenopausal.  Will schedule DEXA scan to evaluate for osteoporosis.  Use of calcium rich diet and exercise was emphasized.  Postmenopausal  Orders: Orders Placed This Encounter  Procedures   DG Bone Density   No orders of the defined types were placed in this encounter.    Follow-Up Instructions: Return in about 4 months (around 01/25/2023) for Osteoarthritis.   Bo Merino, MD  Note - This record has been created using Editor, commissioning.  Chart creation errors have been sought, but may not always  have been located. Such creation errors do not reflect on   the standard of medical care.

## 2022-09-25 ENCOUNTER — Encounter: Payer: Self-pay | Admitting: Rheumatology

## 2022-09-25 ENCOUNTER — Ambulatory Visit: Payer: Managed Care, Other (non HMO) | Attending: Rheumatology | Admitting: Rheumatology

## 2022-09-25 VITALS — BP 135/85 | HR 89 | Ht 63.5 in | Wt 187.0 lb

## 2022-09-25 DIAGNOSIS — Z1589 Genetic susceptibility to other disease: Secondary | ICD-10-CM

## 2022-09-25 DIAGNOSIS — G8929 Other chronic pain: Secondary | ICD-10-CM

## 2022-09-25 DIAGNOSIS — M51369 Other intervertebral disc degeneration, lumbar region without mention of lumbar back pain or lower extremity pain: Secondary | ICD-10-CM

## 2022-09-25 DIAGNOSIS — Z780 Asymptomatic menopausal state: Secondary | ICD-10-CM

## 2022-09-25 DIAGNOSIS — U071 COVID-19: Secondary | ICD-10-CM

## 2022-09-25 DIAGNOSIS — Z8639 Personal history of other endocrine, nutritional and metabolic disease: Secondary | ICD-10-CM

## 2022-09-25 DIAGNOSIS — Z8659 Personal history of other mental and behavioral disorders: Secondary | ICD-10-CM

## 2022-09-25 DIAGNOSIS — M4126 Other idiopathic scoliosis, lumbar region: Secondary | ICD-10-CM | POA: Diagnosis not present

## 2022-09-25 DIAGNOSIS — M791 Myalgia, unspecified site: Secondary | ICD-10-CM

## 2022-09-25 DIAGNOSIS — M5136 Other intervertebral disc degeneration, lumbar region: Secondary | ICD-10-CM

## 2022-09-25 DIAGNOSIS — Z8709 Personal history of other diseases of the respiratory system: Secondary | ICD-10-CM

## 2022-09-25 DIAGNOSIS — Z8669 Personal history of other diseases of the nervous system and sense organs: Secondary | ICD-10-CM

## 2022-09-25 DIAGNOSIS — M533 Sacrococcygeal disorders, not elsewhere classified: Secondary | ICD-10-CM | POA: Diagnosis not present

## 2022-09-25 DIAGNOSIS — Z13820 Encounter for screening for osteoporosis: Secondary | ICD-10-CM

## 2022-09-25 DIAGNOSIS — M3500 Sicca syndrome, unspecified: Secondary | ICD-10-CM

## 2022-11-04 ENCOUNTER — Other Ambulatory Visit (HOSPITAL_BASED_OUTPATIENT_CLINIC_OR_DEPARTMENT_OTHER): Payer: Managed Care, Other (non HMO)

## 2023-01-07 NOTE — Progress Notes (Addendum)
[facility] Note  01/08/2023     CHIEF COMPLAINT Patient presents for Retina Evaluation   HISTORY OF PRESENT ILLNESS: Kara Carey is a 62 y.o. female who presents to the clinic today for:   HPI     Retina Evaluation   In both eyes.  Context:  distance vision, mid-range vision and near vision.  I, the attending physician,  performed the HPI with the patient and updated documentation appropriately.        Comments   Retina eval prior to cataract surgery pt want a eval before surgery pt is reporting cloud vision she denies any flashes or floaters       Last edited by Bernarda Caffey, MD on 01/08/2023 12:27 PM.     Patient feels that her vision is becoming affected by the cataracts.   Referring physician: Daryel Gerald, OD [address]  [place], [place] [address]  HISTORICAL INFORMATION:   Selected notes from the MEDICAL RECORD NUMBER Referred by Dr. Jerline Pain LEE: 02.04.22 Ocular Hx- Vitreous Degeneration OS, Dry MD OU, Cataracts OU, DES OU, Madarosis of eyelids OU.     CURRENT MEDICATIONS: No current outpatient medications on file. (Ophthalmic Drugs)   No current facility-administered medications for this visit. (Ophthalmic Drugs)   Current Outpatient Medications (Other)  Medication Sig   acetaminophen (TYLENOL) 500 MG tablet Take 500-750 mg by mouth every 6 (six) hours as needed for moderate pain or headache.    aspirin EC 81 MG tablet Take 81 mg by mouth as needed.   Azelaic Acid 15 % cream Apply 1 application topically 2 (two) times daily. After skin is thoroughly washed and patted dry, gently but thoroughly massage a thin film of azelaic acid cream into the affected area twice daily, in the morning and evening.   cholecalciferol (VITAMIN D3) 25 MCG (1000 UT) tablet Take 1,000 Units by mouth daily.   Ciclopirox 1 % shampoo Apply topically 2 (two) times a week.   finasteride (PROSCAR) 5 MG tablet Take 5 mg by mouth daily.  (Patient not taking: Reported on 05/27/2022)   ketoconazole (NIZORAL) 2 % shampoo Apply to scalp and shampoo as usual. Repeat 1-2 times weekly   losartan (COZAAR) 25 MG tablet Take 25 mg by mouth daily.   losartan (COZAAR) 50 MG tablet Take 50 mg by mouth daily. (Patient not taking: Reported on 05/27/2022)   LUTEIN PO Take by mouth daily.   TART CHERRY PO Take by mouth daily.   No current facility-administered medications for this visit. (Other)      REVIEW OF SYSTEMS: ROS   Positive for: Musculoskeletal, Eyes, Heme/Lymph Negative for: Constitutional, Gastrointestinal, Neurological, Skin, Genitourinary, HENT, Endocrine, Cardiovascular, Respiratory, Psychiatric, Allergic/Imm Last edited by Parthenia Ames, COT on 01/08/2023  9:23 AM.        ALLERGIES Allergies  Allergen Reactions   Sulfa Antibiotics Anaphylaxis   Clindamycin Diarrhea   Levofloxacin Other (See Comments)    Unknown   Quinolones Other (See Comments)    Nerve damage    PAST MEDICAL HISTORY Past Medical History:  Diagnosis Date   Allergy    Anemia    Anxiety hx of panic attacks   Asthma    Bilateral leg pain    Family history of adverse reaction to anesthesia    parent's had problem coming out of anesthesia-hard to wake up   Past Surgical History:  Procedure Laterality Date   LESION EXCISION  07/2002  left upper arm-local anesthesia   ROBOTIC ASSISTED BILATERAL SALPINGO OOPHERECTOMY Left 09/27/2018   Procedure: XI ROBOTIC ASSISTED LEFT SALPINGO OOPHORECTOMY;  Surgeon: Everitt Amber, MD;  Location: [facility];  Service: Gynecology;  Laterality: Left;    FAMILY HISTORY Family History  Problem Relation Age of Onset   Heart disease Mother    Dementia Mother    Hyperlipidemia Sister    Asthma Brother    Lymphoma Father    Rheum arthritis Father    Heart disease Father    Diabetes Father    Hypertension Sister    Liver disease Brother     SOCIAL HISTORY Social History   Tobacco Use   Smoking  status: Never    Passive exposure: Never   Smokeless tobacco: Never  Vaping Use   Vaping Use: Never used  Substance Use Topics   Alcohol use: No   Drug use: No         OPHTHALMIC EXAM:  Base Eye Exam     Visual Acuity (Snellen - Linear)       Right Left   Dist cc 20/25 -2 20/25 -2   Dist ph cc NI NI    Correction: Contacts         Tonometry (Tonopen, 9:27 AM)       Right Left   Pressure 15 16         Pupils       Pupils Dark Light Shape React APD   Right PERRL 4 3 Round Brisk None   Left PERRL 4 3 Round Brisk None         Visual Fields       Left Right    Full Full         Extraocular Movement       Right Left    Full, Ortho Full, Ortho         Neuro/Psych     Oriented x3: Yes   Mood/Affect: Normal         Dilation     Both eyes: 2.5% Phenylephrine @ 9:27 AM           Slit Lamp and Fundus Exam     Slit Lamp Exam       Right Left   Lids/Lashes Dermatochalasis - upper lid, mild MGD Dermatochalasis - upper lid, mild MGD   Conjunctiva/Sclera White and quiet White and quiet   Cornea 1-2+ inferior Punctate epithelial erosions, mild tear film debris trace Punctate epithelial erosions, mild tear film debris   Anterior Chamber Deep and quiet Deep and quiet   Iris Round and dilated Round and dilated   Lens 2+ Nuclear sclerosis, 3+ Cortical cataract, prominent spokes at 0330 and 0530 2+ Nuclear sclerosis, 2-3+ Cortical cataract   Anterior Vitreous Vitreous syneresis, Posterior vitreous detachment Vitreous syneresis, Posterior vitreous detachment         Fundus Exam       Right Left   Disc Pink and Sharp Pink and Sharp, Compact   C/D Ratio 0.4 0.5   Macula Flat, Good foveal reflex, Drusen, RPE mottling and clumping Flat, Good foveal reflex, Drusen, RPE mottling and clumping, No heme or edema   Vessels attenuated, mild tortuousity attenuated, Tortuous   Periphery Attached, mild reticular degeneration, No RT/RD Attached, pigmented  peripheral cystoid degeneration, No RT/RD           Refraction     Wearing Rx       Sphere Cylinder Axis   Right -3.50 +  0.50 083   Left -3.50 Sphere             IMAGING AND PROCEDURES  Imaging and Procedures for 01/08/2023  OCT, Retina - OU - Both Eyes       Right Eye Quality was good. Central Foveal Thickness: 289. Progression has worsened. Findings include normal foveal contour, no IRF, no SRF, retinal drusen , outer retinal atrophy (Scattered drusen and focal ORA --mild interval progression).   Left Eye Quality was good. Central Foveal Thickness: 297. Progression has worsened. Findings include normal foveal contour, no IRF, no SRF, retinal drusen (Scattered drusen and focal ORA-- mild interval progression ).   Notes *Images captured and stored on drive  Diagnosis / Impression:  Non-exu ARMD OU Scattered drusen and focal ORA OU OD: Scattered drusen and focal ORA --mild interval progression OS: Scattered drusen and focal ORA --mild interval progression  Clinical management:  See below  Abbreviations: NFP - Normal foveal profile. CME - cystoid macular edema. PED - pigment epithelial detachment. IRF - intraretinal fluid. SRF - subretinal fluid. EZ - ellipsoid zone. ERM - epiretinal membrane. ORA - outer retinal atrophy. ORT - outer retinal tubulation. SRHM - subretinal hyper-reflective material. IRHM - intraretinal hyper-reflective material            ASSESSMENT/PLAN:    ICD-10-CM   1. Posterior vitreous detachment of both eyes  H43.813 OCT, Retina - OU - Both Eyes    2. Intermediate stage nonexudative age-related macular degeneration of both eyes  H35.3132 OCT, Retina - OU - Both Eyes    3. Essential hypertension  I10     4. Hypertensive retinopathy of both eyes  H35.033     5. Combined forms of age-related cataract of both eyes  H25.813      1. PVD / vitreous syneresis OU  - symptomatic flashes and floaters OS stably resolved  - Discussed findings  and prognosis  - No RT or RD on 360 peripheral exam  - Reviewed s/s of RT/RD  - Strict return precautions for any such RT/RD signs/symptoms - pt is cleared from a retina standpoint for release to Dr. Jerline Pain and resumption of primary eye care   2. Age related macular degeneration, non-exudative, OU  - intermediate stage -- mild progression of drusen on OCT today - The incidence, anatomy, and pathology of dry AMD, risk of progression, and the AREDS and AREDS 2 studies including smoking risks discussed with patient. - pt states that Preservision caused GI symptoms -- advised trying a different brand of AREDS 2  - continue amsler grid monitoring  3,4. Hypertensive retinopathy OU - discussed importance of tight BP control - monitor  5. Mixed Cataract OU - The symptoms of cataract, surgical options, and treatments and risks were discussed with patient. - discussed diagnosis and progression - clear from a retina standpoint to proceed with cataract surgery when pt and surgeon are ready   Ophthalmic Meds Ordered this visit:  No orders of the defined types were placed in this encounter.      Return if symptoms worsen or fail to improve.  There are no Patient Instructions on file for this visit.  This document serves as a record of services personally performed by Gardiner Sleeper, MD, PhD. It was created on their behalf by Joetta Manners COT, an ophthalmic technician. The creation of this record is the provider's dictation and/or activities during the visit.    Electronically signed by: Joetta Manners COT 03.21.2024 12:30 PM  This  document serves as a record of services personally performed by Gardiner Sleeper, MD, PhD. It was created on their behalf by Renaldo Reel, [place] an ophthalmic technician. The creation of this record is the provider's dictation and/or activities during the visit.    Electronically signed by:  Renaldo Reel, COT  03.22.24  12:30 PM  Gardiner Sleeper, M.D., Ph.D. Diseases & Surgery of the Retina and [place] 01/08/2023    I have reviewed the above documentation for accuracy and completeness, and I agree with the above. Gardiner Sleeper, M.D., Ph.D. 01/08/23 12:30 PM   Abbreviations: M myopia (nearsighted); A astigmatism; H hyperopia (farsighted); P presbyopia; Mrx spectacle prescription;  CTL contact lenses; OD right eye; OS left eye; OU both eyes  XT exotropia; ET esotropia; PEK punctate epithelial keratitis; PEE punctate epithelial erosions; DES dry eye syndrome; MGD meibomian gland dysfunction; ATs artificial tears; PFAT's preservative free artificial tears; [place] nuclear sclerotic cataract; PSC posterior subcapsular cataract; ERM epi-retinal membrane; PVD posterior vitreous detachment; RD retinal detachment; DM diabetes mellitus; DR diabetic retinopathy; NPDR non-proliferative diabetic retinopathy; PDR proliferative diabetic retinopathy; CSME clinically significant macular edema; DME diabetic macular edema; dbh dot blot hemorrhages; CWS cotton wool spot; POAG primary open angle glaucoma; C/D cup-to-disc ratio; HVF humphrey visual field; GVF goldmann visual field; OCT optical coherence tomography; IOP intraocular pressure; BRVO Branch retinal vein occlusion; CRVO central retinal vein occlusion; CRAO central retinal artery occlusion; BRAO branch retinal artery occlusion; RT retinal tear; SB scleral buckle; PPV pars plana vitrectomy; VH Vitreous hemorrhage; PRP panretinal laser photocoagulation; IVK intravitreal kenalog; VMT vitreomacular traction; MH Macular hole;  NVD neovascularization of the disc; NVE neovascularization elsewhere; AREDS age related eye disease study; ARMD age related macular degeneration; POAG primary open angle glaucoma; EBMD epithelial/anterior basement membrane dystrophy; ACIOL anterior chamber intraocular lens; IOL intraocular lens; PCIOL posterior chamber intraocular lens; Phaco/IOL  phacoemulsification with intraocular lens placement; [place] photorefractive keratectomy; LASIK laser assisted in situ keratomileusis; HTN hypertension; DM diabetes mellitus; COPD chronic obstructive pulmonary disease

## 2023-01-08 ENCOUNTER — Ambulatory Visit (INDEPENDENT_AMBULATORY_CARE_PROVIDER_SITE_OTHER): Payer: Managed Care, Other (non HMO) | Admitting: Ophthalmology

## 2023-01-08 ENCOUNTER — Encounter (INDEPENDENT_AMBULATORY_CARE_PROVIDER_SITE_OTHER): Payer: Self-pay | Admitting: Ophthalmology

## 2023-01-08 DIAGNOSIS — H35033 Hypertensive retinopathy, bilateral: Secondary | ICD-10-CM

## 2023-01-08 DIAGNOSIS — H43813 Vitreous degeneration, bilateral: Secondary | ICD-10-CM | POA: Diagnosis not present

## 2023-01-08 DIAGNOSIS — H353132 Nonexudative age-related macular degeneration, bilateral, intermediate dry stage: Secondary | ICD-10-CM | POA: Diagnosis not present

## 2023-01-08 DIAGNOSIS — H25813 Combined forms of age-related cataract, bilateral: Secondary | ICD-10-CM

## 2023-01-08 DIAGNOSIS — I10 Essential (primary) hypertension: Secondary | ICD-10-CM | POA: Diagnosis not present

## 2023-01-08 DIAGNOSIS — H3581 Retinal edema: Secondary | ICD-10-CM

## 2023-01-29 NOTE — Progress Notes (Deleted)
Office Visit Note  Patient: Kara Carey             Date of Birth: August 14, 1961           MRN: [id number]             PCP: Doristine Bosworth, MD (Inactive) Referring: Doristine Bosworth, MD Visit Date: 02/12/2023 Occupation: @GUAROCC @  Subjective:  No chief complaint on file.   History of Present Illness: Kara Carey is a 62 y.o. female ***     Activities of Daily Living:  Patient reports morning stiffness for *** {minute/hour:19697}.   Patient {ACTIONS;DENIES/REPORTS:21021675::"Denies"} nocturnal pain.  Difficulty dressing/grooming: {ACTIONS;DENIES/REPORTS:21021675::"Denies"} Difficulty climbing stairs: {ACTIONS;DENIES/REPORTS:21021675::"Denies"} Difficulty getting out of chair: {ACTIONS;DENIES/REPORTS:21021675::"Denies"} Difficulty using hands for taps, buttons, cutlery, and/or writing: {ACTIONS;DENIES/REPORTS:21021675::"Denies"}  No Rheumatology ROS completed.   PMFS History:  Patient Active Problem List   Diagnosis Date Noted   Cyst of left ovary 09/27/2018   Endometriosis of ovary    DDD (degenerative disc disease), lumbar 09/08/2018   Other idiopathic scoliosis, lumbar region 09/08/2018   History of vitamin D deficiency 08/17/2018   History of asthma 08/17/2018   History of anxiety 08/17/2018   History of uveitis 08/17/2018    Past Medical History:  Diagnosis Date   Allergy    Anemia    Anxiety hx of panic attacks   Asthma    Bilateral leg pain    Family history of adverse reaction to anesthesia    parent's had problem coming out of anesthesia-hard to wake up    Family History  Problem Relation Age of Onset   Heart disease Mother    Dementia Mother    Hyperlipidemia Sister    Asthma Brother    Lymphoma Father    Rheum arthritis Father    Heart disease Father    Diabetes Father    Hypertension Sister    Liver disease Brother    Past Surgical History:  Procedure Laterality Date   LESION EXCISION  07/2002   left upper arm-local  anesthesia   ROBOTIC ASSISTED BILATERAL SALPINGO OOPHERECTOMY Left 09/27/2018   Procedure: XI ROBOTIC ASSISTED LEFT SALPINGO OOPHORECTOMY;  Surgeon: Adolphus Birchwood, MD;  Location: [facility];  Service: Gynecology;  Laterality: Left;   Social History   Social History Narrative   Not on file   Immunization History  Administered Date(s) Administered   Influenza-Unspecified 09/11/2019   PFIZER(Purple Top)SARS-COV-2 Vaccination 01/05/2020, 01/26/2020, 09/10/2020     Objective: Vital Signs: There were no vitals taken for this visit.   Physical Exam   Musculoskeletal Exam: ***  CDAI Exam: CDAI Score: -- Patient Global: --; Provider Global: -- Swollen: --; Tender: -- Joint Exam 02/12/2023   No joint exam has been documented for this visit   There is currently no information documented on the homunculus. Go to the Rheumatology activity and complete the homunculus joint exam.  Investigation: No additional findings.  Imaging: OCT, Retina - OU - Both Eyes  Result Date: 01/08/2023 Right Eye Quality was good. Central Foveal Thickness: 289. Progression has worsened. Findings include normal foveal contour, no IRF, no SRF, retinal drusen , outer retinal atrophy (Scattered drusen and focal ORA --mild interval progression). Left Eye Quality was good. Central Foveal Thickness: 297. Progression has worsened. Findings include normal foveal contour, no IRF, no SRF, retinal drusen (Scattered drusen and focal ORA-- mild interval progression ). Notes *Images captured and stored on drive Diagnosis / Impression: Non-exu ARMD OU Scattered drusen and focal ORA OU OD: Scattered  drusen and focal ORA --mild interval progression OS: Scattered drusen and focal ORA --mild interval progression Clinical management: See below Abbreviations: NFP - Normal foveal profile. CME - cystoid macular edema. PED - pigment epithelial detachment. IRF - intraretinal fluid. SRF - subretinal fluid. EZ - ellipsoid zone. ERM - epiretinal  membrane. ORA - outer retinal atrophy. ORT - outer retinal tubulation. SRHM - subretinal hyper-reflective material. IRHM - intraretinal hyper-reflective material    Recent Labs: Lab Results  Component Value Date   WBC 5.8 09/19/2018   HGB 13.0 09/19/2018   PLT 348 09/19/2018   NA 141 09/19/2018   K 4.4 09/19/2018   CL 105 09/19/2018   CO2 28 09/19/2018   GLUCOSE 90 09/19/2018   BUN 20 09/19/2018   CREATININE 0.81 09/19/2018   BILITOT 0.8 09/19/2018   ALKPHOS 75 09/19/2018   AST 21 09/19/2018   ALT 19 09/19/2018   PROT 7.6 09/19/2018   ALBUMIN 4.1 09/19/2018   CALCIUM 9.3 09/19/2018   GFRAA >60 09/19/2018    Speciality Comments: No specialty comments available.  Procedures:  No procedures performed Allergies: Sulfa antibiotics, Clindamycin, Levofloxacin, and Quinolones   Assessment / Plan:     Visit Diagnoses: Chronic SI joint pain  HLA B27 positive  DDD (degenerative disc disease), lumbar  Other idiopathic scoliosis, lumbar region  History of uveitis  Sicca syndrome  History of anxiety  History of vitamin D deficiency  History of asthma  Orders: No orders of the defined types were placed in this encounter.  No orders of the defined types were placed in this encounter.   Face-to-face time spent with patient was *** minutes. Greater than 50% of time was spent in counseling and coordination of care.  Follow-Up Instructions: No follow-ups on file.   Gearldine Bienenstock, PA-C  Note - This record has been created using Dragon software.  Chart creation errors have been sought, but may not always  have been located. Such creation errors do not reflect on  the standard of medical care.

## 2023-02-05 ENCOUNTER — Ambulatory Visit: Payer: Managed Care, Other (non HMO) | Admitting: Physician Assistant

## 2023-02-12 ENCOUNTER — Ambulatory Visit: Payer: Managed Care, Other (non HMO) | Admitting: Physician Assistant

## 2023-02-12 DIAGNOSIS — M5136 Other intervertebral disc degeneration, lumbar region: Secondary | ICD-10-CM

## 2023-02-12 DIAGNOSIS — Z8659 Personal history of other mental and behavioral disorders: Secondary | ICD-10-CM

## 2023-02-12 DIAGNOSIS — Z13820 Encounter for screening for osteoporosis: Secondary | ICD-10-CM

## 2023-02-12 DIAGNOSIS — G8929 Other chronic pain: Secondary | ICD-10-CM

## 2023-02-12 DIAGNOSIS — Z8639 Personal history of other endocrine, nutritional and metabolic disease: Secondary | ICD-10-CM

## 2023-02-12 DIAGNOSIS — M4126 Other idiopathic scoliosis, lumbar region: Secondary | ICD-10-CM

## 2023-02-12 DIAGNOSIS — Z1589 Genetic susceptibility to other disease: Secondary | ICD-10-CM

## 2023-02-12 DIAGNOSIS — M3500 Sicca syndrome, unspecified: Secondary | ICD-10-CM

## 2023-02-12 DIAGNOSIS — Z8709 Personal history of other diseases of the respiratory system: Secondary | ICD-10-CM

## 2023-02-12 DIAGNOSIS — Z8669 Personal history of other diseases of the nervous system and sense organs: Secondary | ICD-10-CM

## 2023-02-24 NOTE — Progress Notes (Unsigned)
Office Visit Note  Patient: Kara Carey             Date of Birth: [birth year]           MRN: [id number]             PCP: Doristine Bosworth, MD (Inactive) Referring: Doristine Bosworth, MD Visit Date: 03/10/2023 Occupation: @GUAROCC @  Subjective:   History of Present Illness: Kara Carey is a 62 y.o. female with history of uveitis, chronic SI joint pain, and DDD.     Activities of Daily Living:  Patient reports morning stiffness for *** {minute/hour:19697}.   Patient {ACTIONS;DENIES/REPORTS:21021675::"Denies"} nocturnal pain.  Difficulty dressing/grooming: {ACTIONS;DENIES/REPORTS:21021675::"Denies"} Difficulty climbing stairs: {ACTIONS;DENIES/REPORTS:21021675::"Denies"} Difficulty getting out of chair: {ACTIONS;DENIES/REPORTS:21021675::"Denies"} Difficulty using hands for taps, buttons, cutlery, and/or writing: {ACTIONS;DENIES/REPORTS:21021675::"Denies"}  No Rheumatology ROS completed.   PMFS History:  Patient Active Problem List   Diagnosis Date Noted  . Cyst of left ovary 09/27/2018  . Endometriosis of ovary   . DDD (degenerative disc disease), lumbar 09/08/2018  . Other idiopathic scoliosis, lumbar region 09/08/2018  . History of vitamin D deficiency 08/17/2018  . History of asthma 08/17/2018  . History of anxiety 08/17/2018  . History of uveitis 08/17/2018    Past Medical History:  Diagnosis Date  . Allergy   . Anemia   . Anxiety hx of panic attacks  . Asthma   . Bilateral leg pain   . Family history of adverse reaction to anesthesia    parent's had problem coming out of anesthesia-hard to wake up    Family History  Problem Relation Age of Onset  . Heart disease Mother   . Dementia Mother   . Hyperlipidemia Sister   . Asthma Brother   . Lymphoma Father   . Rheum arthritis Father   . Heart disease Father   . Diabetes Father   . Hypertension Sister   . Liver disease Brother    Past Surgical History:  Procedure Laterality Date  . LESION  EXCISION  07/2002   left upper arm-local anesthesia  . ROBOTIC ASSISTED BILATERAL SALPINGO OOPHERECTOMY Left 09/27/2018   Procedure: XI ROBOTIC ASSISTED LEFT SALPINGO OOPHORECTOMY;  Surgeon: Adolphus Birchwood, MD;  Location: [facility];  Service: Gynecology;  Laterality: Left;   Social History   Social History Narrative  . Not on file   Immunization History  Administered Date(s) Administered  . Influenza-Unspecified 09/11/2019  . PFIZER(Purple Top)SARS-COV-2 Vaccination 01/05/2020, 01/26/2020, 09/10/2020     Objective: Vital Signs: There were no vitals taken for this visit.   Physical Exam Vitals and nursing note reviewed.  Constitutional:      Appearance: She is well-developed.  HENT:     Head: Normocephalic and atraumatic.  Eyes:     Conjunctiva/sclera: Conjunctivae normal.  Cardiovascular:     Rate and Rhythm: Normal rate and regular rhythm.     Heart sounds: Normal heart sounds.  Pulmonary:     Effort: Pulmonary effort is normal.     Breath sounds: Normal breath sounds.  Abdominal:     General: Bowel sounds are normal.     Palpations: Abdomen is soft.  Musculoskeletal:     Cervical back: Normal range of motion.  Lymphadenopathy:     Cervical: No cervical adenopathy.  Skin:    General: Skin is warm and dry.     Capillary Refill: Capillary refill takes less than 2 seconds.  Neurological:     Mental Status: She is alert and oriented to person, place, and  time.  Psychiatric:        Behavior: Behavior normal.     Musculoskeletal Exam: ***  CDAI Exam: CDAI Score: -- Patient Global: --; Provider Global: -- Swollen: --; Tender: -- Joint Exam 03/10/2023   No joint exam has been documented for this visit   There is currently no information documented on the homunculus. Go to the Rheumatology activity and complete the homunculus joint exam.  Investigation: No additional findings.  Imaging: No results found.  Recent Labs: Lab Results  Component Value Date   WBC 5.8  09/19/2018   HGB 13.0 09/19/2018   PLT 348 09/19/2018   NA 141 09/19/2018   K 4.4 09/19/2018   CL 105 09/19/2018   CO2 28 09/19/2018   GLUCOSE 90 09/19/2018   BUN 20 09/19/2018   CREATININE 0.81 09/19/2018   BILITOT 0.8 09/19/2018   ALKPHOS 75 09/19/2018   AST 21 09/19/2018   ALT 19 09/19/2018   PROT 7.6 09/19/2018   ALBUMIN 4.1 09/19/2018   CALCIUM 9.3 09/19/2018   GFRAA >60 09/19/2018    Speciality Comments: No specialty comments available.  Procedures:  No procedures performed Allergies: Sulfa antibiotics, Clindamycin, Levofloxacin, and Quinolones   Assessment / Plan:     Visit Diagnoses: Chronic SI joint pain  HLA B27 positive  DDD (degenerative disc disease), lumbar  Other idiopathic scoliosis, lumbar region  History of uveitis  Sicca syndrome (HCC)  History of anxiety  History of vitamin D deficiency  History of asthma  Osteoporosis screening  Orders: No orders of the defined types were placed in this encounter.  No orders of the defined types were placed in this encounter.   Face-to-face time spent with patient was *** minutes. Greater than 50% of time was spent in counseling and coordination of care.  Follow-Up Instructions: No follow-ups on file.   Gearldine Bienenstock, PA-C  Note - This record has been created using Dragon software.  Chart creation errors have been sought, but may not always  have been located. Such creation errors do not reflect on  the standard of medical care.,

## 2023-03-10 ENCOUNTER — Encounter: Payer: Self-pay | Admitting: Physician Assistant

## 2023-03-10 ENCOUNTER — Ambulatory Visit: Payer: PRIVATE HEALTH INSURANCE | Attending: Physician Assistant | Admitting: Physician Assistant

## 2023-03-10 VITALS — BP 136/77 | HR 61 | Resp 16 | Ht 63.0 in | Wt 191.0 lb

## 2023-03-10 DIAGNOSIS — G8929 Other chronic pain: Secondary | ICD-10-CM

## 2023-03-10 DIAGNOSIS — M5136 Other intervertebral disc degeneration, lumbar region: Secondary | ICD-10-CM | POA: Diagnosis not present

## 2023-03-10 DIAGNOSIS — Z1589 Genetic susceptibility to other disease: Secondary | ICD-10-CM | POA: Diagnosis not present

## 2023-03-10 DIAGNOSIS — Z8669 Personal history of other diseases of the nervous system and sense organs: Secondary | ICD-10-CM

## 2023-03-10 DIAGNOSIS — M4126 Other idiopathic scoliosis, lumbar region: Secondary | ICD-10-CM | POA: Diagnosis not present

## 2023-03-10 DIAGNOSIS — Z8709 Personal history of other diseases of the respiratory system: Secondary | ICD-10-CM

## 2023-03-10 DIAGNOSIS — Z13820 Encounter for screening for osteoporosis: Secondary | ICD-10-CM

## 2023-03-10 DIAGNOSIS — M533 Sacrococcygeal disorders, not elsewhere classified: Secondary | ICD-10-CM

## 2023-03-10 DIAGNOSIS — M3500 Sicca syndrome, unspecified: Secondary | ICD-10-CM

## 2023-03-10 DIAGNOSIS — Z8659 Personal history of other mental and behavioral disorders: Secondary | ICD-10-CM

## 2023-03-10 DIAGNOSIS — Z8639 Personal history of other endocrine, nutritional and metabolic disease: Secondary | ICD-10-CM

## 2023-08-27 NOTE — Progress Notes (Signed)
Office Visit Note  Patient: Kara Carey             Date of Birth: 01-24-1961           MRN: [id number]             PCP: Camie Patience, FNP Referring: Camie Patience, FNP Visit Date: 09/09/2023 Occupation: @GUAROCC @  Subjective:  Dry eyes and joint pain  History of Present Illness: Kara Carey is a 62 y.o. female with osteoarthritis, degenerative disc disease and sicca symptoms.  She states she continues to have dry eyes.  She has been using over-the-counter products.  She has mild symptoms of dry mouth.  She continues to have intermittent discomfort in her lower back.  She has not any recent problems with SI joint pain.  There is no history of Achilles tendinitis or plantar fasciitis or inflammatory arthritis.  She had no recurrence of uveitis in many years.  She states that she had COVID-19 virus infection last year she has been having some problems with the nasal congestion and hearing loss.  She had hearing test at [facility] and no cause but cause was established.  She denies myalgias but have some sensitivity to touch.  Patient states she would not get the DEXA scan due to family situation.    Activities of Daily Living:  Patient reports morning stiffness for 0 minutes.   Patient Reports nocturnal pain.  Difficulty dressing/grooming: Denies Difficulty climbing stairs: Denies Difficulty getting out of chair: Denies Difficulty using hands for taps, buttons, cutlery, and/or writing: Denies  Review of Systems  Constitutional:  Negative for fatigue.  HENT:  Negative for mouth sores and mouth dryness.   Eyes:  Positive for itching and dryness. Negative for pain.  Respiratory:  Negative for shortness of breath.   Cardiovascular:  Negative for chest pain and palpitations.  Gastrointestinal:  Negative for blood in stool, constipation and diarrhea.  Endocrine: Negative for increased urination.  Genitourinary:  Negative for involuntary urination.  Musculoskeletal:   Positive for myalgias, muscle tenderness and myalgias. Negative for joint pain, gait problem, joint pain, joint swelling, muscle weakness and morning stiffness.  Skin:  Positive for hair loss. Negative for color change, rash and sensitivity to sunlight.  Allergic/Immunologic: Negative for susceptible to infections.  Neurological:  Negative for dizziness and headaches.  Hematological:  Positive for swollen glands.  Psychiatric/Behavioral:  Negative for depressed mood and sleep disturbance. The patient is nervous/anxious.     PMFS History:  Patient Active Problem List   Diagnosis Date Noted   Cyst of left ovary 09/27/2018   Endometriosis of ovary    DDD (degenerative disc disease), lumbar 09/08/2018   Other idiopathic scoliosis, lumbar region 09/08/2018   History of vitamin D deficiency 08/17/2018   History of asthma 08/17/2018   History of anxiety 08/17/2018   History of uveitis 08/17/2018    Past Medical History:  Diagnosis Date   Allergy    Anemia    Anxiety hx of panic attacks   Asthma    Bilateral leg pain    Family history of adverse reaction to anesthesia    parent's had problem coming out of anesthesia-hard to wake up    Family History  Problem Relation Age of Onset   Heart disease Mother    Dementia Mother    Lymphoma Father    Rheum arthritis Father    Heart disease Father    Diabetes Father    Hyperlipidemia Sister  Hypertension Sister    Asthma Brother    Liver disease Brother    Past Surgical History:  Procedure Laterality Date   LESION EXCISION  07/2002   left upper arm-local anesthesia   ROBOTIC ASSISTED BILATERAL SALPINGO OOPHERECTOMY Left 09/27/2018   Procedure: XI ROBOTIC ASSISTED LEFT SALPINGO OOPHORECTOMY;  Surgeon: Adolphus Birchwood, MD;  Location: [facility];  Service: Gynecology;  Laterality: Left;   Social History   Social History Narrative   Not on file   Immunization History  Administered Date(s) Administered   Influenza-Unspecified 09/11/2019    PFIZER(Purple Top)SARS-COV-2 Vaccination 01/05/2020, 01/26/2020, 09/10/2020     Objective: Vital Signs: BP 133/84 (BP Location: Right Arm, Patient Position: Sitting, Cuff Size: Normal)   Pulse 76   Resp 15   Ht 5' 3.5" (1.613 m)   Wt 192 lb (87.1 kg)   BMI 33.48 kg/m    Physical Exam Vitals and nursing note reviewed.  Constitutional:      Appearance: She is well-developed.  HENT:     Head: Normocephalic and atraumatic.  Eyes:     Conjunctiva/sclera: Conjunctivae normal.  Cardiovascular:     Rate and Rhythm: Normal rate and regular rhythm.     Heart sounds: Normal heart sounds.  Pulmonary:     Effort: Pulmonary effort is normal.     Breath sounds: Normal breath sounds.  Abdominal:     General: Bowel sounds are normal.     Palpations: Abdomen is soft.  Musculoskeletal:     Cervical back: Normal range of motion.  Lymphadenopathy:     Cervical: No cervical adenopathy.  Skin:    General: Skin is warm and dry.     Capillary Refill: Capillary refill takes less than 2 seconds.  Neurological:     Mental Status: She is alert and oriented to person, place, and time.  Psychiatric:        Behavior: Behavior normal.      Musculoskeletal Exam: Cervical, thoracic and lumbar spine were in good range of motion.  She had no SI joint tenderness.  Shoulder joints, elbow joints, wrist joints, MCPs PIPs and DIPs with good range of motion with no synovitis.  Hip joints and knee joints in good range of motion.  She had no tenderness over ankles or MTPs.  CDAI Exam: CDAI Score: -- Patient Global: --; Provider Global: -- Swollen: --; Tender: -- Joint Exam 09/09/2023   No joint exam has been documented for this visit   There is currently no information documented on the homunculus. Go to the Rheumatology activity and complete the homunculus joint exam.  Investigation: No additional findings.  Imaging: No results found.  Recent Labs: Lab Results  Component Value Date   WBC 5.8  09/19/2018   HGB 13.0 09/19/2018   PLT 348 09/19/2018   NA 141 09/19/2018   K 4.4 09/19/2018   CL 105 09/19/2018   CO2 28 09/19/2018   GLUCOSE 90 09/19/2018   BUN 20 09/19/2018   CREATININE 0.81 09/19/2018   BILITOT 0.8 09/19/2018   ALKPHOS 75 09/19/2018   AST 21 09/19/2018   ALT 19 09/19/2018   PROT 7.6 09/19/2018   ALBUMIN 4.1 09/19/2018   CALCIUM 9.3 09/19/2018   GFRAA >60 09/19/2018    Speciality Comments: No specialty comments available.  Procedures:  No procedures performed Allergies: Sulfa antibiotics, Clindamycin, Levofloxacin, and Quinolones   Assessment / Plan:     Visit Diagnoses: Chronic SI joint pain - HLA-B27+, Hx of uveitis (age 31), X-rays of the  pelvis were obtained on 08/17/2018 which revealed bilateral SI joint sclerosis and mild narrowing of the left SI.  These findings were consistent with osteoarthritis.  She had 2 SI joint tenderness on the examination.  She had no recurrence of uveitis.  HLA B27 positive  Spondylosis of lumbar spine -she had good range of motion of thoracic and lumbar spine.  She had no difficulty reaching her toes.  She has intermittent discomfort in her lower back.  Core strengthening exercises were emphasized.  X-rays of the lumbar spine were obtained on 08/17/2018: findings are consistent with levoscoliosis, spondylosis and facet joint arthropathy.  Other idiopathic scoliosis, lumbar region - X-rays of the lumbar spine were obtained on 08/17/2018: findings are consistent with levoscoliosis, spondylosis and facet joint arthropathy.  History of uveitis-patient had an episode of uveitis at age 87 without any recurrence.  She has had rosacea in her eyes.  Sicca syndrome (HCC)-she continues to have dry mouth and dry eye symptoms.  She is using Systane eyedrops.  For the dry mouth over-the-counter products were discussed at length.  I also discussed the option of pilocarpine in the future if her symptoms get worse.  Her symptoms are  manageable currently.  Primary hypertension-she is on Cozaar.  Her blood pressure was elevated at 148/82 today.  Repeat blood pressure was 123/84.  She was advised to monitor blood pressure closely and follow-up with the PCP if needed.  History of anxiety  History of vitamin D deficiency-she takes vitamin D 1000 units daily.  History of asthma  Osteoporosis screening - DEXA ordered.  Patient states she could not get DEXA scan due to her busy schedule.  She will schedule DEXA scan.  Calcium rich diet and exercise was emphasized.  Orders: No orders of the defined types were placed in this encounter.  No orders of the defined types were placed in this encounter.    Follow-Up Instructions: Return in about 1 year (around 09/08/2024) for Osteoarthritis.   Pollyann Savoy, MD  Note - This record has been created using Animal nutritionist.  Chart creation errors have been sought, but may not always  have been located. Such creation errors do not reflect on  the standard of medical care.

## 2023-09-09 ENCOUNTER — Encounter: Payer: Self-pay | Admitting: Rheumatology

## 2023-09-09 ENCOUNTER — Ambulatory Visit: Payer: PRIVATE HEALTH INSURANCE | Attending: Rheumatology | Admitting: Rheumatology

## 2023-09-09 VITALS — BP 133/84 | HR 76 | Resp 15 | Ht 63.5 in | Wt 192.0 lb

## 2023-09-09 DIAGNOSIS — M4126 Other idiopathic scoliosis, lumbar region: Secondary | ICD-10-CM

## 2023-09-09 DIAGNOSIS — Z8659 Personal history of other mental and behavioral disorders: Secondary | ICD-10-CM

## 2023-09-09 DIAGNOSIS — M47816 Spondylosis without myelopathy or radiculopathy, lumbar region: Secondary | ICD-10-CM

## 2023-09-09 DIAGNOSIS — Z8639 Personal history of other endocrine, nutritional and metabolic disease: Secondary | ICD-10-CM

## 2023-09-09 DIAGNOSIS — M533 Sacrococcygeal disorders, not elsewhere classified: Secondary | ICD-10-CM

## 2023-09-09 DIAGNOSIS — Z1589 Genetic susceptibility to other disease: Secondary | ICD-10-CM

## 2023-09-09 DIAGNOSIS — Z8669 Personal history of other diseases of the nervous system and sense organs: Secondary | ICD-10-CM

## 2023-09-09 DIAGNOSIS — G8929 Other chronic pain: Secondary | ICD-10-CM

## 2023-09-09 DIAGNOSIS — I10 Essential (primary) hypertension: Secondary | ICD-10-CM

## 2023-09-09 DIAGNOSIS — Z8709 Personal history of other diseases of the respiratory system: Secondary | ICD-10-CM

## 2023-09-09 DIAGNOSIS — M3500 Sicca syndrome, unspecified: Secondary | ICD-10-CM

## 2023-09-09 DIAGNOSIS — Z13820 Encounter for screening for osteoporosis: Secondary | ICD-10-CM

## 2024-08-08 ENCOUNTER — Encounter: Payer: Self-pay | Admitting: Rheumatology

## 2024-08-08 ENCOUNTER — Ambulatory Visit: Attending: Rheumatology | Admitting: Rheumatology

## 2024-08-08 VITALS — BP 128/81 | HR 75 | Temp 97.4°F | Resp 16 | Ht 63.0 in | Wt 193.6 lb

## 2024-08-08 DIAGNOSIS — M7061 Trochanteric bursitis, right hip: Secondary | ICD-10-CM | POA: Insufficient documentation

## 2024-08-08 DIAGNOSIS — Z8669 Personal history of other diseases of the nervous system and sense organs: Secondary | ICD-10-CM | POA: Diagnosis present

## 2024-08-08 DIAGNOSIS — M533 Sacrococcygeal disorders, not elsewhere classified: Secondary | ICD-10-CM | POA: Diagnosis present

## 2024-08-08 DIAGNOSIS — E559 Vitamin D deficiency, unspecified: Secondary | ICD-10-CM | POA: Diagnosis present

## 2024-08-08 DIAGNOSIS — Z8709 Personal history of other diseases of the respiratory system: Secondary | ICD-10-CM | POA: Diagnosis present

## 2024-08-08 DIAGNOSIS — M3500 Sicca syndrome, unspecified: Secondary | ICD-10-CM | POA: Diagnosis present

## 2024-08-08 DIAGNOSIS — Z1589 Genetic susceptibility to other disease: Secondary | ICD-10-CM | POA: Insufficient documentation

## 2024-08-08 DIAGNOSIS — Z13820 Encounter for screening for osteoporosis: Secondary | ICD-10-CM | POA: Insufficient documentation

## 2024-08-08 DIAGNOSIS — G8929 Other chronic pain: Secondary | ICD-10-CM | POA: Insufficient documentation

## 2024-08-08 DIAGNOSIS — Z8659 Personal history of other mental and behavioral disorders: Secondary | ICD-10-CM | POA: Diagnosis present

## 2024-08-08 DIAGNOSIS — I10 Essential (primary) hypertension: Secondary | ICD-10-CM | POA: Insufficient documentation

## 2024-08-08 DIAGNOSIS — M47816 Spondylosis without myelopathy or radiculopathy, lumbar region: Secondary | ICD-10-CM | POA: Insufficient documentation

## 2024-08-08 DIAGNOSIS — R5383 Other fatigue: Secondary | ICD-10-CM | POA: Diagnosis present

## 2024-08-08 DIAGNOSIS — M4126 Other idiopathic scoliosis, lumbar region: Secondary | ICD-10-CM | POA: Insufficient documentation

## 2024-08-08 DIAGNOSIS — M791 Myalgia, unspecified site: Secondary | ICD-10-CM | POA: Insufficient documentation

## 2024-08-08 NOTE — Progress Notes (Signed)
 Office Visit Note  Patient: Kara Carey             Date of Birth: October 14, 1961           MRN: [id number]             PCP: Dyane Anthony RAMAN, FNP Referring: Dyane Anthony RAMAN, FNP Visit Date: 08/08/2024 Occupation: Data Unavailable  Subjective:  Fatigue and myalgia  History of Present Illness: Kara Carey is a 63 y.o. female with degenerative disc disease, uveitis, HLA-B27 positive.  She returns today after her last visit in November 2024.  She states she gets intermittent flares of increased muscle pain and fatigue.  She is currently going through a flare with increased fatigue and muscle pain.  She states she was having generalized achiness around shoulders and legs.  She also felt like she had flulike symptoms.  She had congestion in her right ear and also her sinuses.  She found a cervical lymph node.  She states she was evaluated by her PCP and she was told that there was no infection.  She continues to have some discomfort in her lower back.  She has been having discomfort over the right trochanteric region which has been radiating into her right leg per patient.  She states she has discomfort when she sleeps on her right side.  She continues to have dry mouth and dry eyes.  She has not had any flares of uveitis.    Activities of Daily Living:  Patient reports morning stiffness for a few minutes.   Patient Reports nocturnal pain.  Difficulty dressing/grooming: Denies Difficulty climbing stairs: Reports Difficulty getting out of chair: Denies Difficulty using hands for taps, buttons, cutlery, and/or writing: Denies  Review of Systems  Constitutional:  Positive for fatigue.  HENT:  Positive for mouth dryness. Negative for mouth sores.   Eyes:  Positive for dryness.  Respiratory:  Negative for shortness of breath.   Cardiovascular:  Negative for chest pain and palpitations.  Gastrointestinal:  Negative for blood in stool, constipation and diarrhea.  Endocrine:  Negative for increased urination.  Genitourinary:  Negative for involuntary urination.  Musculoskeletal:  Positive for joint pain, joint pain, myalgias, morning stiffness, muscle tenderness and myalgias. Negative for gait problem, joint swelling and muscle weakness.  Skin:  Negative for color change, rash, hair loss and sensitivity to sunlight.  Allergic/Immunologic: Negative for susceptible to infections.  Neurological:  Negative for dizziness and headaches.  Hematological:  Positive for swollen glands.  Psychiatric/Behavioral:  Positive for sleep disturbance. Negative for depressed mood. The patient is nervous/anxious.     PMFS History:  Patient Active Problem List   Diagnosis Date Noted   Cyst of left ovary 09/27/2018   Endometriosis of ovary    DDD (degenerative disc disease), lumbar 09/08/2018   Other idiopathic scoliosis, lumbar region 09/08/2018   History of vitamin D  deficiency 08/17/2018   History of asthma 08/17/2018   History of anxiety 08/17/2018   History of uveitis 08/17/2018    Past Medical History:  Diagnosis Date   Allergy    Anemia    Anxiety hx of panic attacks   Asthma    Bilateral leg pain    Family history of adverse reaction to anesthesia    parent's had problem coming out of anesthesia-hard to wake up    Family History  Problem Relation Age of Onset   Heart disease Mother    Dementia Mother    Lymphoma Father  Rheum arthritis Father    Heart disease Father    Diabetes Father    Hyperlipidemia Sister    Hypertension Sister    Asthma Brother    Liver disease Brother    Past Surgical History:  Procedure Laterality Date   CATARACT EXTRACTION     12/2023, 02/2024   LESION EXCISION  07/2002   left upper arm-local anesthesia   ROBOTIC ASSISTED BILATERAL SALPINGO OOPHERECTOMY Left 09/27/2018   Procedure: XI ROBOTIC ASSISTED LEFT SALPINGO OOPHORECTOMY;  Surgeon: Eloy Herring, MD;  Location: [facility];  Service: Gynecology;  Laterality: Left;   Social  History   Tobacco Use   Smoking status: Never    Passive exposure: Never   Smokeless tobacco: Never  Vaping Use   Vaping status: Never Used  Substance Use Topics   Alcohol use: No   Drug use: No   Social History   Social History Narrative   Not on file     Immunization History  Administered Date(s) Administered   Influenza-Unspecified 09/11/2019   PFIZER(Purple Top)SARS-COV-2 Vaccination 01/05/2020, 01/26/2020, 09/10/2020     Objective: Vital Signs: BP 128/81   Pulse 75   Temp (!) 97.4 F (36.3 C)   Resp 16   Ht 5' 3 (1.6 m)   Wt 193 lb 9.6 oz (87.8 kg)   BMI 34.29 kg/m    Physical Exam Vitals and nursing note reviewed.  Constitutional:      Appearance: She is well-developed.  HENT:     Head: Normocephalic and atraumatic.  Eyes:     Conjunctiva/sclera: Conjunctivae normal.  Cardiovascular:     Rate and Rhythm: Normal rate and regular rhythm.     Heart sounds: Normal heart sounds.  Pulmonary:     Effort: Pulmonary effort is normal.     Breath sounds: Normal breath sounds.  Abdominal:     General: Bowel sounds are normal.     Palpations: Abdomen is soft.  Musculoskeletal:     Cervical back: Normal range of motion.  Lymphadenopathy:     Cervical: No cervical adenopathy.  Skin:    General: Skin is warm and dry.     Capillary Refill: Capillary refill takes less than 2 seconds.  Neurological:     Mental Status: She is alert and oriented to person, place, and time.  Psychiatric:        Behavior: Behavior normal.      Musculoskeletal Exam: Cervical, thoracic and lumbar spine were in good range of motion.  There was no SI joint tenderness.  Shoulder joints, elbow joints, wrist joints, MCPs, PIPs and DIPs were in good range of motion with no synovitis.  PIP and DIP thickening was noted with no synovitis.  Hip joints and knee joints were in good range of motion without any warmth swelling or effusion.  She had tenderness on palpation over the right  trochanteric bursa.  There was no tenderness over ankles or MTPs.  No muscular weakness or tenderness was noted.   CDAI Exam: CDAI Score: -- Patient Global: --; Provider Global: -- Swollen: --; Tender: -- Joint Exam 08/08/2024   No joint exam has been documented for this visit   There is currently no information documented on the homunculus. Go to the Rheumatology activity and complete the homunculus joint exam.  Investigation: No additional findings.  Imaging: No results found.  Recent Labs: Lab Results  Component Value Date   WBC 5.8 09/19/2018   HGB 13.0 09/19/2018   PLT 348 09/19/2018   NA  141 09/19/2018   K 4.4 09/19/2018   CL 105 09/19/2018   CO2 28 09/19/2018   GLUCOSE 90 09/19/2018   BUN 20 09/19/2018   CREATININE 0.81 09/19/2018   BILITOT 0.8 09/19/2018   ALKPHOS 75 09/19/2018   AST 21 09/19/2018   ALT 19 09/19/2018   PROT 7.6 09/19/2018   ALBUMIN 4.1 09/19/2018   CALCIUM 9.3 09/19/2018   GFRAA >60 09/19/2018    Speciality Comments: No specialty comments available.  Procedures:  No procedures performed Allergies: Sulfa antibiotics, Clindamycin, Levofloxacin, and Quinolones   Assessment / Plan:     Visit Diagnoses: Myalgia -patient states she is having a flare currently with increased fatigue and muscle pain.  No muscular weakness or tenderness was noted on the examination.  She had no difficulty getting up from the chair.  Plan: CK.  Will call her when the lab results are available.  Other fatigue -she has been experiencing increased fatigue.  I will obtain following labs.  Plan: Sedimentation rate, CBC with Differential/Platelet, Comprehensive metabolic panel with GFR, TSH, Serum protein electrophoresis with reflex, ANA.  Will notify her of the lab results.  Chronic SI joint pain - HLA-B27+, Hx of uveitis (age 66), X-rays of the pelvis were obtained on 08/17/2018 which revealed bilateral SI joint sclerosis and mild narrowing of the left  SI  Trochanteric bursitis, right hip-she has been having discomfort over the right trochanteric region for the last month.  She states her symptoms are gradually getting better.  She also reports nocturnal pain.  A handout on IT band stretches was given.  I also discussed possible cortisone injection if the symptoms persist.  Spondylosis of lumbar spine -she has chronic lower back pain.  She had good mobility without any point tenderness.  X-rays of the lumbar spine were obtained on 08/17/2018: findings are consistent with levoscoliosis, spondylosis and facet joint arthropathy.  Other idiopathic scoliosis, lumbar region - X-rays of the lumbar spine were obtained on 08/17/2018: findings are consistent with levoscoliosis, spondylosis and facet joint arthropathy.  History of uveitis-no recurrence of uveitis since she was 44.  HLA B27 positive  Sicca syndrome-she has mild sicca symptoms which are manageable.  History of anxiety  Vitamin D  deficiency -she is history of vitamin D  deficiency.  Will check vitamin D  level.  Plan: VITAMIN D  25 Hydroxy (Vit-D Deficiency, Fractures)  History of asthma  Primary hypertension-blood pressure was normal today.  Osteoporosis screening-patient states that she will get DEXA scan this month.  Orders: Orders Placed This Encounter  Procedures   CK   VITAMIN D  25 Hydroxy (Vit-D Deficiency, Fractures)   Sedimentation rate   CBC with Differential/Platelet   Comprehensive metabolic panel with GFR   TSH   Serum protein electrophoresis with reflex   ANA   No orders of the defined types were placed in this encounter.    Follow-Up Instructions: Return in about 1 year (around 08/08/2025) for Osteoarthritis.   Maya Nash, MD  Note - This record has been created using Animal nutritionist.  Chart creation errors have been sought, but may not always  have been located. Such creation errors do not reflect on  the standard of medical care.

## 2024-08-08 NOTE — Patient Instructions (Signed)
 Iliotibial Band Syndrome Rehab Ask your health care provider which exercises are safe for you. Do exercises exactly as told by your provider and adjust them as told. It's normal to feel mild stretching, pulling, tightness, or discomfort as you do these exercises. Stop right away if you feel sudden pain or your pain gets a lot worse. Do not begin these exercises until told by your provider. Stretching and range-of-motion exercises These exercises warm up your muscles and joints. They also improve the movement and flexibility of your hip and pelvis. Quadriceps stretch, prone  Lie face down (prone) on a firm surface like a bed or padded floor. Bend your left / right knee. Reach back to hold your ankle or pant leg. If you can't reach your ankle or pant leg, use a belt looped around your foot and grab the belt instead. Gently pull your heel toward your butt. Your knee should not slide out to the side. You should feel a stretch in the front of your thigh and knee, also called the quadriceps. Hold this position for __________ seconds. Repeat __________ times. Complete this exercise __________ times a day. Iliotibial band stretch The iliotibial band is a strip of tissue that runs along the outside of your hip down to your knee. Lie on your side with your left / right leg on top. Bend both knees and grab your left / right ankle. Stretch out your bottom arm to help you balance. Slowly bring your top knee back so your thigh goes behind your back. Slowly lower your top leg toward the floor until you feel a gentle stretch on the outside of your left / right hip and thigh. If you don't feel a stretch and your knee won't go farther, place the heel of your other foot on top of your knee and pull your knee down toward the floor with your foot. Hold this position for __________ seconds. Repeat __________ times. Complete this exercise __________ times a day. Strengthening exercises These exercises build strength  and endurance in your hip and pelvis. Endurance means your muscles can keep working even when they're tired. Straight leg raises, side-lying This exercise strengthens the muscles that rotate the leg at the hip and move it away from your body. These muscles are called hip abductors. Lie on your side with your left / right leg on top. Lie so your head, shoulder, hip, and knee line up. You can bend your bottom knee to help you balance. Roll your hips slightly forward so they're stacked directly over each other. Your left / right knee should face forward. Tense the muscles in your outer thigh and hip. Lift your top leg 4-6 inches (10-15 cm) off the ground. Hold this position for __________ seconds. Slowly lower your leg back down to the starting position. Let your muscles fully relax before doing this exercise again. Repeat __________ times. Complete this exercise __________ times a day. Leg raises, prone This exercise strengthens the muscles that move the hips backward. These muscles are called hip extensors. Lie face down (prone) on your bed or a firm surface. You can put a pillow under your hips for comfort and to support your lower back. Bend your left / right knee so your foot points straight up toward the ceiling. Keep the other leg straight and behind you. Squeeze your butt muscles. Lift your left / right thigh off the firm surface. Do not let your back arch. Tense your thigh muscle as hard as you can without having  more knee pain. Hold this position for __________ seconds. Slowly lower your leg to the starting position. Allow your leg to relax all the way. Repeat __________ times. Complete this exercise __________ times a day. Hip hike  Stand sideways on a bottom step. Place your feet so that your left / right leg is on the step, and the other foot is hanging off the side. If you need support for balance, hold onto a railing or wall. Keep your knees straight and your abdomen square,  meaning your hips are level. Then, lift your left / right hip up toward the ceiling. Slowly let your leg that's hanging off the step lower towards the floor. Your foot should get closer to the ground. Do not lean or bend your knees during this movement. Repeat __________ times. Complete this exercise __________ times a day. This information is not intended to replace advice given to you by your health care provider. Make sure you discuss any questions you have with your health care provider. Document Revised: 12/18/2022 Document Reviewed: 12/18/2022 Elsevier Patient Education  2024 [employer].

## 2024-08-10 LAB — CBC WITH DIFFERENTIAL/PLATELET
Absolute Lymphocytes: 1613 {cells}/uL (ref 850–3900)
Absolute Monocytes: 564 {cells}/uL (ref 200–950)
Basophils Absolute: 80 {cells}/uL (ref 0–200)
Basophils Relative: 1.4 %
Eosinophils Absolute: 211 {cells}/uL (ref 15–500)
Eosinophils Relative: 3.7 %
HCT: 40.6 % (ref 35.0–45.0)
Hemoglobin: 13.3 g/dL (ref 11.7–15.5)
MCH: 29.6 pg (ref 27.0–33.0)
MCHC: 32.8 g/dL (ref 32.0–36.0)
MCV: 90.2 fL (ref 80.0–100.0)
MPV: 9.5 fL (ref 7.5–12.5)
Monocytes Relative: 9.9 %
Neutro Abs: 3232 {cells}/uL (ref 1500–7800)
Neutrophils Relative %: 56.7 %
Platelets: 381 Thousand/uL (ref 140–400)
RBC: 4.5 Million/uL (ref 3.80–5.10)
RDW: 12.1 % (ref 11.0–15.0)
Total Lymphocyte: 28.3 %
WBC: 5.7 Thousand/uL (ref 3.8–10.8)

## 2024-08-10 LAB — COMPREHENSIVE METABOLIC PANEL WITH GFR
AG Ratio: 1.7 (calc) (ref 1.0–2.5)
ALT: 17 U/L (ref 6–29)
AST: 15 U/L (ref 10–35)
Albumin: 4.5 g/dL (ref 3.6–5.1)
Alkaline phosphatase (APISO): 80 U/L (ref 37–153)
BUN: 19 mg/dL (ref 7–25)
CO2: 29 mmol/L (ref 20–32)
Calcium: 9.8 mg/dL (ref 8.6–10.4)
Chloride: 101 mmol/L (ref 98–110)
Creat: 0.71 mg/dL (ref 0.50–1.05)
Globulin: 2.6 g/dL (ref 1.9–3.7)
Glucose, Bld: 86 mg/dL (ref 65–99)
Potassium: 4.6 mmol/L (ref 3.5–5.3)
Sodium: 138 mmol/L (ref 135–146)
Total Bilirubin: 0.4 mg/dL (ref 0.2–1.2)
Total Protein: 7.1 g/dL (ref 6.1–8.1)
eGFR: 94 mL/min/1.73m2 (ref >=60)

## 2024-08-10 LAB — PROTEIN ELECTROPHORESIS, SERUM, WITH REFLEX
Albumin ELP: 4.1 g/dL (ref 3.8–4.8)
Alpha 1: 0.3 g/dL (ref 0.2–0.3)
Alpha 2: 0.8 g/dL (ref 0.5–0.9)
Beta 2: 0.5 g/dL (ref 0.2–0.5)
Beta Globulin: 0.4 g/dL (ref 0.4–0.6)
Gamma Globulin: 1 g/dL (ref 0.8–1.7)
Total Protein: 7.1 g/dL (ref 6.1–8.1)

## 2024-08-10 LAB — CK: Total CK: 75 U/L (ref 20–243)

## 2024-08-10 LAB — VITAMIN D 25 HYDROXY (VIT D DEFICIENCY, FRACTURES): Vit D, 25-Hydroxy: 26 ng/mL — ABNORMAL LOW (ref 30–100)

## 2024-08-10 LAB — ANA: Anti Nuclear Antibody (ANA): NEGATIVE

## 2024-08-10 LAB — TSH: TSH: 1.96 m[IU]/L (ref 0.40–4.50)

## 2024-08-10 LAB — SEDIMENTATION RATE: Sed Rate: 34 mm/h — ABNORMAL HIGH (ref 0–30)

## 2024-08-11 ENCOUNTER — Ambulatory Visit: Payer: Self-pay | Admitting: Rheumatology

## 2024-08-11 NOTE — Progress Notes (Signed)
 Vitamin D  is low at 26, sed rate is mildly elevated, CK normal, CBC normal, CMP normal, TSH normal, SPEP normal, ANA negative.  Patient should take vitamin D  2000 units daily.

## 2024-09-08 ENCOUNTER — Ambulatory Visit: Payer: PRIVATE HEALTH INSURANCE | Admitting: Rheumatology

## 2025-08-22 ENCOUNTER — Ambulatory Visit: Admitting: Rheumatology
